# Patient Record
Sex: Male | Born: 1970 | Race: White | Hispanic: Yes | Marital: Married | Smoking: Current every day smoker
Health system: Southern US, Community
[De-identification: ages and names within clinical notes are randomized; demographics above are authoritative.]

## PROBLEM LIST (undated history)

## (undated) DIAGNOSIS — M109 Gout, unspecified: Secondary | ICD-10-CM

## (undated) DIAGNOSIS — I10 Essential (primary) hypertension: Secondary | ICD-10-CM

## (undated) HISTORY — DX: Gout, unspecified: M10.9

## (undated) HISTORY — PX: NO PAST SURGERIES: SHX2092

## (undated) HISTORY — DX: Essential (primary) hypertension: I10

---

## 2007-01-22 ENCOUNTER — Emergency Department (HOSPITAL_COMMUNITY): Admission: EM | Admit: 2007-01-22 | Discharge: 2007-01-22 | Payer: Self-pay | Admitting: Emergency Medicine

## 2010-09-06 ENCOUNTER — Emergency Department (HOSPITAL_COMMUNITY)
Admission: EM | Admit: 2010-09-06 | Discharge: 2010-09-06 | Payer: Self-pay | Source: Home / Self Care | Admitting: Family Medicine

## 2010-12-29 LAB — URIC ACID: Uric Acid, Serum: 8.1 mg/dL — ABNORMAL HIGH (ref 4.0–7.8)

## 2012-03-31 ENCOUNTER — Encounter (HOSPITAL_COMMUNITY): Payer: Self-pay | Admitting: Emergency Medicine

## 2012-03-31 ENCOUNTER — Emergency Department (HOSPITAL_COMMUNITY)
Admission: EM | Admit: 2012-03-31 | Discharge: 2012-03-31 | Disposition: A | Discharge status: Home / Self Care | Payer: 59 | Attending: Emergency Medicine | Admitting: Emergency Medicine

## 2012-03-31 DIAGNOSIS — F17200 Nicotine dependence, unspecified, uncomplicated: Secondary | ICD-10-CM | POA: Insufficient documentation

## 2012-03-31 DIAGNOSIS — H6690 Otitis media, unspecified, unspecified ear: Secondary | ICD-10-CM | POA: Insufficient documentation

## 2012-03-31 MED ORDER — AMOXICILLIN 500 MG PO CAPS: 500.0000 mg | ORAL_CAPSULE | Freq: Three times a day (TID) | ORAL | Status: AC

## 2012-03-31 MED ORDER — ANTIPYRINE-BENZOCAINE 5.4-1.4 % OT SOLN
3.0000 [drp] | Freq: Once | OTIC | Status: AC
  Administered 2012-03-31: 3 [drp] via OTIC
  Filled 2012-03-31: qty 10

## 2012-03-31 NOTE — Discharge Instructions (Signed)
Use Drops: 3-4 drops in right ear for pain.

## 2012-03-31 NOTE — ED Provider Notes (Signed)
Medical screening examination/treatment/procedure(s) were performed by non-physician practitioner and as supervising physician I was immediately available for consultation/collaboration.   Lars Jeziorski, MD 03/31/12 1641 

## 2012-03-31 NOTE — ED Notes (Signed)
Pt c/o right ear pain x 2 days 

## 2012-03-31 NOTE — ED Provider Notes (Signed)
History     CSN: [id number]  Arrival date & time 03/31/12  7829   First MD Initiated Contact with Patient 03/31/12 1049     10:50 AM HPI Patient reports a right-sided ear ache for 2 days. Reports he feels he has decreased hearing. Denies recent water activities. Denies ear drainage. Denies fever, sore throat, neck pain.  Patient is a 41 y.o. male presenting with ear pain. The history is provided by the patient.  Otalgia The current episode started 2 days ago. There is pain in the right ear. The problem occurs constantly. The problem has been gradually worsening. Maximum temperature: Subjective. The pain is moderate. Pertinent negatives include no ear discharge, no headaches, no rhinorrhea, no sore throat, no abdominal pain, no diarrhea, no vomiting, no neck pain, no cough and no rash. Hearing loss: decreased hearing. His past medical history does not include chronic ear infection.    History reviewed. No pertinent past medical history.  History reviewed. No pertinent past surgical history.  History reviewed. No pertinent family history.  History  Substance Use Topics  . Smoking status: Current Everyday Smoker  . Smokeless tobacco: Not on file  . Alcohol Use: Yes      Review of Systems  Constitutional: Positive for fever (Subjective). Negative for chills.  HENT: Positive for ear pain. Negative for congestion, sore throat, rhinorrhea, trouble swallowing, neck pain, sinus pressure, tinnitus and ear discharge. Hearing loss: decreased hearing.   Eyes: Negative for photophobia, pain and visual disturbance.  Respiratory: Negative for cough.   Gastrointestinal: Negative for vomiting, abdominal pain and diarrhea.  Skin: Negative for rash.  Neurological: Negative for dizziness and headaches.  All other systems reviewed and are negative.    Allergies  Review of patient's allergies indicates no known allergies.  Home Medications   Current Outpatient Rx  Name Route Sig Dispense  Refill  . ACETAMINOPHEN 325 MG PO TABS Oral Take 650 mg by mouth every 6 (six) hours as needed. For pain      BP 143/79  Pulse 92  Temp 98.3 F (36.8 C) (Oral)  Resp 16  SpO2 95%  Physical Exam  Constitutional: He is oriented to person, place, and time. He appears well-developed and well-nourished.  HENT:  Head: Normocephalic and atraumatic.  Right Ear: External ear and ear canal normal. No drainage or swelling. No mastoid tenderness. Tympanic membrane is injected, erythematous and bulging. Tympanic membrane is not scarred and not perforated. A middle ear effusion is present. No hemotympanum.  Left Ear: Tympanic membrane, external ear and ear canal normal.  Nose: Nose normal.  Mouth/Throat: Uvula is midline, oropharynx is clear and moist and mucous membranes are normal.  Eyes: Conjunctivae are normal. Pupils are equal, round, and reactive to light. Right eye exhibits no discharge. Left eye exhibits no discharge.  Neck: Normal range of motion. Neck supple.  Lymphadenopathy:    He has no cervical adenopathy.  Neurological: He is alert and oriented to person, place, and time.  Skin: Skin is warm and dry. No rash noted. No erythema. No pallor.  Psychiatric: He has a normal mood and affect. His behavior is normal.    ED Course  Procedures   MDM    Patient given Auralgan drops for pain. Also prescribed amoxicillin for otitis media.      Thomasene Lot, PA-C 03/31/12 1151

## 2013-07-10 ENCOUNTER — Emergency Department (HOSPITAL_COMMUNITY)
Admission: EM | Admit: 2013-07-10 | Discharge: 2013-07-10 | Disposition: A | Discharge status: Home / Self Care | Payer: 59 | Source: Home / Self Care | Attending: Family Medicine | Admitting: Family Medicine

## 2013-07-10 ENCOUNTER — Encounter (HOSPITAL_COMMUNITY): Payer: Self-pay | Admitting: Emergency Medicine

## 2013-07-10 DIAGNOSIS — M109 Gout, unspecified: Secondary | ICD-10-CM

## 2013-07-10 LAB — BASIC METABOLIC PANEL
Chloride: 98 mEq/L (ref 96–112)
Creatinine, Ser: 0.78 mg/dL (ref 0.50–1.35)
GFR calc Af Amer: >90 mL/min (ref >90)
Potassium: 4.1 mEq/L (ref 3.5–5.1)

## 2013-07-10 LAB — URIC ACID: Uric Acid, Serum: 7.6 mg/dL (ref 4.0–7.8)

## 2013-07-10 MED ORDER — HYDROCODONE-ACETAMINOPHEN 5-325 MG PO TABS: 1.0000 | ORAL_TABLET | Freq: Four times a day (QID) | ORAL | Status: DC | PRN

## 2013-07-10 MED ORDER — COLCHICINE 0.6 MG PO TABS: 0.6000 mg | ORAL_TABLET | Freq: Two times a day (BID) | ORAL | Status: DC | PRN

## 2013-07-10 NOTE — ED Provider Notes (Signed)
Jimmy Hart is a 42 y.o. male who presents to Urgent Care today for right great MTP pain. He notes great toe pain and swelling started over the last few days. He denies any injury. This is consistent with prior gout episode. He takes over-the-counter pain medications which have not helped. He denies any radiating pain weakness or numbness and feels well otherwise   History reviewed. No pertinent past medical history. History  Substance Use Topics  . Smoking status: Current Every Day Smoker  . Smokeless tobacco: Not on file  . Alcohol Use: Yes   ROS as above Medications reviewed. No current facility-administered medications for this encounter.   Current Outpatient Prescriptions  Medication Sig Dispense Refill  . acetaminophen (TYLENOL) 325 MG tablet Take 650 mg by mouth every 6 (six) hours as needed. For pain      . colchicine 0.6 MG tablet Take 1 tablet (0.6 mg total) by mouth 2 (two) times daily as needed.  60 tablet  0  . HYDROcodone-acetaminophen (NORCO/VICODIN) 5-325 MG per tablet Take 1 tablet by mouth every 6 (six) hours as needed for pain.  15 tablet  0    Exam:  BP 142/103  Pulse 75  Temp(Src) 99.1 F (37.3 C) (Oral)  Resp 20  SpO2 98% Gen: Well NAD RIGHT GREAT TOE MTP: Red and swollen. Pain with motion and tender to touch.  Capillary refill and sensation is intact distally  Results for orders placed during the hospital encounter of 07/10/13 (from the past 24 hour(s))  URIC ACID     Status: None   Collection Time    07/10/13 10:12 AM      Result Value Range   Uric Acid, Serum 7.6  4.0 - 7.8 mg/dL  BASIC METABOLIC PANEL     Status: Abnormal   Collection Time    07/10/13 10:12 AM      Result Value Range   Sodium 137  135 - 145 mEq/L   Potassium 4.1  3.5 - 5.1 mEq/L   Chloride 98  96 - 112 mEq/L   CO2 28  19 - 32 mEq/L   Glucose, Bld 110 (*) 70 - 99 mg/dL   BUN 10  6 - 23 mg/dL   Creatinine, Ser 1.61  0.50 - 1.35 mg/dL   Calcium 09.6  8.4 - 04.5 mg/dL   GFR calc non Af Amer >90  >90 mL/min   GFR calc Af Amer >90  >90 mL/min   No results found.  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  Plan to use colchicine and hydrocodone for pain control Followup with Dr. Eustaquio Boyden at [facility] Discussed warning signs or symptoms. Please see discharge instructions. Patient expresses understanding.      Rodolph Bong, MD 07/10/13 [phone]

## 2013-07-10 NOTE — ED Notes (Signed)
Pt c/o gout flare up on right foot/greater toe onset Sunday Sxs include: swelling, localized fever, redness pain that increases w/activity Denies: fevers, inj/trauma He is alert w/no signs of acute distress.

## 2013-07-16 NOTE — ED Notes (Signed)
Uric Acid: 7.6 high normal. Pt. treated with Colchicine and Hydrocodone.  Hx. of gout. Instructed to f/u with his PCP. Vassie Moselle 07/16/2013

## 2013-07-17 ENCOUNTER — Telehealth (HOSPITAL_COMMUNITY): Payer: Self-pay | Admitting: *Deleted

## 2013-07-18 ENCOUNTER — Telehealth (HOSPITAL_COMMUNITY): Payer: Self-pay | Admitting: *Deleted

## 2013-07-20 ENCOUNTER — Telehealth (HOSPITAL_COMMUNITY): Payer: Self-pay | Admitting: *Deleted

## 2013-07-20 NOTE — ED Notes (Signed)
Dr. Denyse Amass asked me to notify pt. of his uric acid result and tell him to f/u with his PCP. Unable to reach pt. by phone 9/30 and 10/1-both phone numbers disconnected.  Letter sent with this information. Vassie Moselle 07/20/2013

## 2014-10-26 ENCOUNTER — Ambulatory Visit (INDEPENDENT_AMBULATORY_CARE_PROVIDER_SITE_OTHER): Payer: 59 | Admitting: Emergency Medicine

## 2014-10-26 VITALS — BP 162/110 | HR 79 | Temp 98.5°F | Resp 16 | Ht 65.75 in | Wt 185.0 lb

## 2014-10-26 DIAGNOSIS — M10072 Idiopathic gout, left ankle and foot: Secondary | ICD-10-CM

## 2014-10-26 DIAGNOSIS — I10 Essential (primary) hypertension: Secondary | ICD-10-CM

## 2014-10-26 DIAGNOSIS — M109 Gout, unspecified: Secondary | ICD-10-CM

## 2014-10-26 MED ORDER — INDOMETHACIN 25 MG PO CAPS
25.0000 mg | ORAL_CAPSULE | Freq: Three times a day (TID) | ORAL | Status: DC
Start: 2014-10-26 — End: 2016-01-29

## 2014-10-26 MED ORDER — LISINOPRIL 20 MG PO TABS: 20.0000 mg | ORAL_TABLET | Freq: Every day | ORAL | Status: DC

## 2014-10-26 MED ORDER — COLCHICINE 0.6 MG PO TABS: ORAL_TABLET | ORAL | Status: DC

## 2014-10-26 NOTE — Addendum Note (Signed)
Addended by: Carmelina DaneANDERSON, Salam Micucci S on: 10/26/2014 11:53 AM   Modules accepted: Level of Service

## 2014-10-26 NOTE — Progress Notes (Signed)
[facility] [address], [place] [id number] [phone]  Date:  10/26/2014   Name:  Jimmy Hart   DOB:  04-Nov-1970   MRN:  [id number]  PCP:  No PCP Per Patient    Chief Complaint: Gout   History of Present Illness:  Jimmy Hart is a 44 y.o. very pleasant male patient who presents with the following:  History of gout.  Has sudden pain with no history of injury or overuse in left ankle. No fever or chillls Marked difficulty ambulating and bearing weight No hisotry of known hypertension No improvement with over the counter medications or other home remedies. Denies other complaint or health concern today.   There are no active problems to display for this patient.   History reviewed. No pertinent past medical history.  History reviewed. No pertinent past surgical history.  History  Substance Use Topics  . Smoking status: Current Every Day Smoker  . Smokeless tobacco: Not on file  . Alcohol Use: Yes    History reviewed. No pertinent family history.  No Known Allergies  Medication list has been reviewed and updated.  Current Outpatient Prescriptions on File Prior to Visit  Medication Sig Dispense Refill  . acetaminophen (TYLENOL) 325 MG tablet Take 650 mg by mouth every 6 (six) hours as needed. For pain    . colchicine 0.6 MG tablet Take 1 tablet (0.6 mg total) by mouth 2 (two) times daily as needed. (Patient not taking: Reported on 10/26/2014) 60 tablet 0  . HYDROcodone-acetaminophen (NORCO/VICODIN) 5-325 MG per tablet Take 1 tablet by mouth every 6 (six) hours as needed for pain. (Patient not taking: Reported on 10/26/2014) 15 tablet 0   No current facility-administered medications on file prior to visit.    Review of Systems:  As per HPI, otherwise negative.    Physical Examination: Filed Vitals:   10/26/14 1130  BP: 162/110  Pulse: 79  Temp: 98.5 F (36.9 C)  Resp: 16   Filed Vitals:   10/26/14 1130  Height: 5' 5.75"  (1.67 m)  Weight: 185 lb (83.915 kg)   Body mass index is 30.09 kg/(m^2). Ideal Body Weight: Weight in (lb) to have BMI = 25: 153.4   GEN: WDWN, NAD, Non-toxic, Alert & Oriented x 3 HEENT: Atraumatic, Normocephalic.  Ears and Nose: No external deformity. EXTR: No clubbing/cyanosis/edema NEURO: Normal gait.  PSYCH: Normally interactive. Conversant. Not depressed or anxious appearing.  Calm demeanor.  LEFT ankle exquisitely tender some swelling no erythema or ecchymosis.  No deformity   Assessment and Plan: Gout Hypertension Lisinopril Colchicine Indocin  Signed,  Phillips OdorJeffery Lanna Labella, MD

## 2014-10-26 NOTE — Patient Instructions (Signed)
Hipertensin (Hypertension) La hipertensin, conocida comnmente como presin arterial alta, se produce cuando la sangre bombea en las arterias con mucha fuerza. Las arterias son los vasos sanguneos que transportan la sangre desde el corazn hacia todas las partes del cuerpo. Una lectura de la presin arterial consiste en un nmero ms alto sobre un nmero ms bajo, por ejemplo, 110/72. El nmero ms alto (presin sistlica) corresponde a la presin interna de las arterias cuando el corazn [place]. El nmero ms bajo (presin diastlica) corresponde a la presin interna de las arterias cuando el corazn se relaja. En condiciones ideales, la presin arterial debe ser inferior a 120/80. La hipertensin fuerza al corazn a trabajar ms para Herbalist. Las arterias pueden estrecharse o ponerse rgidas. La hipertensin conlleva el riesgo de enfermedad cardaca, ictus y otros problemas.  [place] de riesgo de hipertensin son controlables, pero otros no lo son.  [employer] factores de riesgo que usted no puede Chief Technology Officer, se incluyen:   Manufacturing systems engineer. El riesgo es mayor para las Retail banker.  La edad. Los riesgos aumentan con la edad.  El sexo. Antes de los 45aos, los hombres corren ms [employer]. Despus de los 65aos, las mujeres corren ms [employer]. Entre los factores de riesgo que usted puede Chief Technology Officer, se incluyen:  No hacer la cantidad suficiente de actividad fsica o ejercicio.  Tener sobrepeso.  Consumir mucha grasa, azcar, caloras o sal en la dieta.  Beber alcohol en exceso. SIGNOS Y SNTOMAS Por lo general, la hipertensin no causa signos o sntomas. La hipertensin demasiado alta (crisis hipertensiva) puede causar dolor de cabeza, ansiedad, falta de aire y hemorragia nasal. DIAGNSTICO  Para detectar si usted tiene hipertensin, el mdico le medir la presin arterial mientras est sentado, con el brazo  levantado a la altura del corazn. Debe medirla al [facility] veces en el mismo brazo. Determinadas condiciones pueden causar una diferencia de presin arterial entre el brazo izquierdo y Insurance underwriter. El hecho de tener una sola lectura de la presin arterial ms alta que lo normal no significa que Stage manager. En el caso de tener una lectura de la presin arterial con un valor alto, pdale al mdico que la verifique nuevamente. [place] hipertensin arterial incluye hacer cambios en el estilo de vida y, posiblemente, tomar medicamentos. Un estilo de vida saludable puede ayudar a bajar la presin arterial alta. Quiz deba cambiar algunos hbitos. Los [employer] Strauss en el estilo de vida pueden incluir:  Seguir la dieta DASH. Esta dieta tiene un alto contenido de frutas, verduras y Psychologist, prison and probation services. Incluye poca cantidad de sal, carnes rojas y azcares agregados.  Hacer al menos 2horas de actividad fsica enrgica todas las semanas.  Perder peso, si es necesario.  No fumar.  Limitar el consumo de bebidas alcohlicas.  Aprender formas de reducir el estrs. Si los cambios en el estilo de vida no son suficientes para Child psychotherapist la presin arterial, el mdico puede recetarle medicamentos. Quiz necesite tomar ms de uno. Trabaje en conjunto con su mdico para comprender los riesgos y los beneficios. INSTRUCCIONES PARA EL CUIDADO EN EL HOGAR  Haga que le midan de nuevo la presin arterial segn las indicaciones del [place] los medicamentos solamente como se lo haya indicado el mdico. Siga cuidadosamente las indicaciones. Los medicamentos para la presin arterial deben tomarse segn las indicaciones. Los medicamentos pierden eficacia al omitir las dosis. El hecho de omitir  las dosis tambin One William Carls Driveaumenta el riesgo de otros Altaproblemas.  No fume.  Contrlese la presin arterial en su casa segn las indicaciones del mdico. SOLICITE ATENCIN MDICA SI:   Piensa  que tiene una reaccin alrgica a los medicamentos.  Tiene mareos o dolores de cabeza con Naval architectrecurrencia.  Tiene hinchazn en los tobillos.  Tiene problemas de visin. SOLICITE ATENCIN MDICA DE INMEDIATO SI:  Siente un dolor de cabeza intenso o confusin.  Siente debilidad inusual, adormecimiento o que Hospital doctorse desmayar.  Siente dolor intenso en el pecho o en el abdomen.  Vomita repetidas veces.  Tiene dificultad para respirar. ASEGRESE DE QUE:   Comprende estas instrucciones.  Controlar su afeccin.  Recibir ayuda de inmediato si no mejora o si empeora. Document Released: 10/04/2005 Document Revised: 02/18/2014 [facility] Patient Information 2015 [place], [place]. This information is not intended to replace advice given to you by your health care provider. Make sure you discuss any questions you have with your health care provider. Gota  (Gout)  La gota es una artritis inflamatoria originada por la acumulacin de cristales de cido rico en las articulaciones. El cido rico es una sustancia qumica que normalmente se encuentra en la [place]. Cuando los niveles de cido rico en la sangre son muy elevados, pueden formarse cristales que se depositan en las articulaciones y los tejidos. Esto causa irritacin, dolor e hinchazn (inflamacin). La repeticin de los ataques es frecuente. Con el tiempo, los cristales de cido rico pueden formar Toys ''R'' Usmasas (tofos) cerca de Risk analystuna articulacin, destruyen el [place] y provocan una deformidad La gota es una enfermedad que puede tratarse y con frecuencia puede prevenirse. CAUSAS  La enfermedad comienza con niveles altos de cido rico en la sangre. El organismo produce cido rico cuando metaboliza una sustancia que se encuentra en Binghamton Universityestado natural, denominada purina. Ciertos alimentos, como carnes y pescado, contienen grandes cantidades de purinas. Las causas de cido rico elevado son:   Ser transmitida de padres a hijos (hereditaria).  Enfermedades que  causan un aumento de la produccin de cido rico (como la obesidad, la psoriasis y ciertos tipos de Database administratorcncer).  Abuso en el consumo de bebidas alcohlicas.  La dieta, especialmente las Ryder Systemdietas en las que se consume Iranmucha carne y frutos de mar.  Ciertos medicamentos, como los que combaten el cncer (quimioterapia), los diurticos y la aspirina.  Enfermedades renales crnicas. Los riones no pueden eliminar bien el cido rico.  Problemas con el metabolismo. Las enfermedades fuertemente asociadas a la gota son:   Janene HarveyObesidad.  Hipertensin arterial.  Colesterol elevado.  Diabetes. No todas las personas con niveles elevados de cido rico sufren gota. No se comprende an por qu algunas personas padecen gota y otras no. Las Ventresscirugas, lesiones en una articulacin y el consumo excesivo de ciertos alimentos son algunos de los factores que pueden provocar ataques de [place].  SNTOMAS   Un ataque de gota puede comenzar rpidamente. Causa un dolor intenso, con irritacin, hinchazn y calor en una articulacin.  Puede haber fiebre.  Generalmente slo una articulacin es [place] Rapidsafectada. Ciertas articulaciones se ven implicadas con ms frecuencia.  La base del dedo gordo del pie.  La rodilla.  El tobillo.  Webb LawsLa mueca.  Un dedo. Sin tratamiento, un ataque por lo general desaparece en unos pocos das o semanas. Entre un ataque y otro, por lo general no hay sntomas, lo cual es diferente de At[place]portmuchas otras formas de artritis.  DIAGNSTICO  El profesional reunir la informacin basndose en los sntomas y el examen fsico. En  algunos [employer]. Estos estudios pueden ser:   Anlisis de [place].  Anlisis de [place].  Radiografas.  Anlisis de los fluidos de Nurse, learning disability. En este estudio se necesita una aguja para extraer lquido de la articulacin (artrocentesis). Con el uso del microscopio, se confirma la gota cuando se observan los cristales de cido rico en el lquido de Merchant navy officer. TRATAMIENTO  ONEOK fases en el tratamiento de la gota: el tratamiento del ataque de aparicin repentina (agudo) y la prevencin de los ataques (profilaxis).   Tratamiento de un ataque agudo.  Se utilizan medicamentos. Se indican antiinflamatorios o corticoides.  En algunos casos es necesario inyectar un corticoide en la articulacin afectada.  La articulacin dolorosa se pone en reposo. El movimiento puede empeorar la artritis.  Puede utilizar tanto tratamientos con calor o con fro para Scientific laboratory technician, segn lo que le haga mejor.  Tratamiento para prevenir ataques.  Si sufre de ataques de gota frecuentes, el mdico puede recomendarle medicamentos preventivos. Estos medicamentos se administran despus de que el ataque agudo [place]. Estos medicamentos ayudan a los riones a Land cido rico del organismo o a Conservator, museum/gallery produccin de cido rico. Es posible que deba Chemical engineer estos medicamentos durante un largo [place].  La primera fase del tratamiento preventiva puede asociarse con un aumento de los ataques agudos de [place]. Por esta razn, durante los primeros meses de [place] mdico puede tambin aconsejarle que tome medicamentos que habitualmente se [place] para el tratamiento de la gota aguda. Asegrese de comprender todas las indicaciones del mdico. El mdico podr hacer varios ajustes a la dosis del medicamento antes de que comiencen a ser Geologist, engineering.  Comente el tratamiento diettico con su mdico o nutricionista. El alcohol y las bebidas que contienen gran cantidad de International aid/development worker y fructosa y los alimentos como la carne, el pollo y los frutos de mar pueden aumentar los niveles de cido rico. El mdico o el nutricionista podr [employer] las bebidas y los alimentos que debe limitar. INSTRUCCIONES PARA EL CUIDADO EN EL HOGAR   No tome aspirina para el dolor. Esto eleva los niveles de cido rico.  Slo tome medicamentos de venta  libre o prescriptos para Primary school teacher, las molestias o Publishing copy la fiebre segn las indicaciones de su mdico.  Sports administrator reposo todo el tiempo que pueda. Cuando se encuentre en la cama, mantenga las sbanas y [address] alejadas de las articulaciones doloridas.  Mantenga la articulacin afectada levantada (elevada).  Aplique compresas fras o calientes sobre las articulaciones doloridas. el uso de compresas calientes o fras depende de lo que [employer] resulte a usted.  Utilice muletas si la articulacin que le duele es de la pierna.  Debe ingerir gran cantidad de lquido para mantener la orina de tono claro o color amarillo plido. Esto ayudar a que el organismo elimine el cido rico. Limite el consumo de alcohol, bebidas azucaradas y que contengan fructosa.  Siga las indicaciones con respecto a la dieta. Preste especial atencin a la cantidad de protenas que consume. En la dieta diaria debe enfatizar el consumo de frutas, vegetales, granos enteros y productos lcteos descremados. Comente con su mdico o nutricionista acerca del consumo de caf, vitamina C o cerezas. Estos pueden ayudar a [employer] de cido rico.  Mantenga un peso corporal adecuado. SOLICITE ATENCIN MDICA SI:   Tiene diarrea, vmitos o algn efecto secundario provocado por los medicamentos.  No se siente mejor en 24 horas, o  empeora. SOLICITE ATENCIN MDICA DE INMEDIATO SI:   Las articulaciones le duelen ms de manera repentina, tiene escalofros o fiebre. ASEGRESE DE QUE:   Comprende estas instrucciones.  Controlar su enfermedad.  Solicitar ayuda de inmediato si no mejora o si empeora. Document Released: 07/14/2005 Document Revised: 01/29/2013 [facility] Patient Information 2015 [place], [place]. This information is not intended to replace advice given to you by your health care provider. Make sure you discuss any questions you have with your health care provider.

## 2015-05-01 ENCOUNTER — Ambulatory Visit: Payer: Worker's Compensation

## 2015-05-01 ENCOUNTER — Encounter: Payer: Self-pay | Admitting: Physician Assistant

## 2015-05-01 ENCOUNTER — Ambulatory Visit (INDEPENDENT_AMBULATORY_CARE_PROVIDER_SITE_OTHER): Payer: 59 | Admitting: Emergency Medicine

## 2015-05-01 DIAGNOSIS — S61219A Laceration without foreign body of unspecified finger without damage to nail, initial encounter: Secondary | ICD-10-CM

## 2015-05-01 DIAGNOSIS — T148XXA Other injury of unspecified body region, initial encounter: Secondary | ICD-10-CM

## 2015-05-01 DIAGNOSIS — Z23 Encounter for immunization: Secondary | ICD-10-CM | POA: Diagnosis not present

## 2015-05-01 DIAGNOSIS — S61316A Laceration without foreign body of right little finger with damage to nail, initial encounter: Secondary | ICD-10-CM | POA: Diagnosis not present

## 2015-05-01 DIAGNOSIS — I10 Essential (primary) hypertension: Secondary | ICD-10-CM | POA: Insufficient documentation

## 2015-05-01 DIAGNOSIS — S61319A Laceration without foreign body of unspecified finger with damage to nail, initial encounter: Secondary | ICD-10-CM

## 2015-05-01 DIAGNOSIS — T148 Other injury of unspecified body region: Secondary | ICD-10-CM

## 2015-05-01 MED ORDER — CEPHALEXIN 500 MG PO CAPS: 500.0000 mg | ORAL_CAPSULE | Freq: Three times a day (TID) | ORAL | Status: DC

## 2015-05-01 NOTE — Progress Notes (Signed)
   Subjective:    Patient ID: Jimmy Hart, male    DOB: 26-Sep-1971, 44 y.o.   MRN: [id number]  Chief Complaint  Patient presents with  . Laceration   Medications, allergies, past medical history, surgical history, family history, social history and problem list reviewed and updated.  HPI  [age] yom presents after sustaining laceration to right 5th digit. Caught finger in blade of mower. Approx 1.5 hrs pta. Unsure of last tdap.   Review of Systems No cp, sob.     Objective:   Physical Exam  Constitutional: He is oriented to person, place, and time.  Neurological: He is alert and oriented to person, place, and time.  Skin:  Elliptical laceration to dorsal distal right 5th digit. Proximal base of fingernail is intact and fully exposed. Decreased strength but intact finger extension and flexion with testing. Normal sensation.    [facility] reading (PRIMARY) by  Dr. Dareen PianoAnderson. Right 5th digit findings: Open tufts fracture.      Assessment & Plan:   Finger laceration, initial encounter - Plan: DG Finger Little Right, cephALEXin (KEFLEX) 500 MG capsule  Nailbed laceration, finger, initial encounter - Plan: cephALEXin (KEFLEX) 500 MG capsule  Open fracture  Need for Tdap vaccination - Plan: Tdap vaccine greater than or equal to 7yo IM --tufts fx --> keflex --wound repair as per procedure note, finger wrapped and placed in fold over splint --pt to rtc 4 days for wound check  --tdap given  Jimmy Hart M. Jimmy Holstine, PA-C Physician Assistant-Certified [facility]  05/01/2015 5:27 PM

## 2015-05-01 NOTE — Patient Instructions (Signed)
Please keep the dressing in place for 24 hours.  You can shower normally after 24 hours.  Please take the antibiotic three times daily for the next 10 days.  Please keep the splint over the finger for the next few days.  Please come back to see us on Monday.    Cuidados de Hotel manageruna laceracin - Adultos  (Laceration Care, Adult)  Una herida cortante es un corte o lesin que atraviesa todas las capas de la piel y el tejido que se encuentra debajo de la piel.  TRATAMIENTO  Algunas laceraciones no requieren sutura. Algunas no deben cerrarse debido a que puede aumentar el riesgo de infeccin. Es importante que consulte al mdico lo antes posible despus de recibir una lesin para minimizar el riesgo de infeccin y aumentar la posibilidad de que se cierre con xito.  Cuando se cierra adecuadamente, podrn indicarle analgsicos, si los necesita. La herida debe limpiarse para combatir la infeccin. El mdico usar puntos (suturas), [place],adhesivo, o tiras Cascadeadhesivas para Environmental consultantreparar la laceracin. Estos elementos mantendrn unidos los bordes de la piel para que se cure ms rpidamente y para un mejor resultado cosmtico. Sin embargo, todas las heridas se curarn con una cicatriz. Una vez que la herida se haya curado, las cicatrices pueden minimizarse cubriendo la herida con pantalla solar durante el da por un lapso se 1 ao.  INSTRUCCIONES PARA EL CUIDADO EN EL HOGAR  Si tiene puntos o grapas:   Mantenga la herida limpia y [place] Islandsseca.  Si tiene un (vendaje) cmbielo al menos una vez al da. Cmbielo si se moja o se ensucia, o segn las indicaciones del mdico.  Lave el corte dos veces por da con agua y [place]jabn. Enjuguelo con agua para quitar todo el Imperialjabn. Seque dando palmaditas con una toalla limpia y seca.  Despus de limpiar, aplique una delgada capa de una crema con antibitico segn las indicaciones del mdico. Esto le ayudar a prevenir las infecciones y a Automotive engineerevitar que el vendaje se Building services engineeradhiera.  Puede ducharse  despus de las primeras 24 horas. No remoje la herida en agua hasta que le hayan quitado los puntos.  Solo tome medicamentos que se pueden comprar sin receta o recetados para Chief Technology Officerel dolor, Dentistmalestar o fiebre, como le indica el mdico.  Concurra para que le retiren los puntos o las grapas cuando el mdico le indique. En caso que tenga tiras ZOXWRUEAVadhesivas:   Mantenga la herida limpia y seca.  No deje que las tiras se mojen. Puede darse un bao cuidando de Devon Energymantener la herida seca.  Si se moja, squela dando palmaditas con una toalla limpia.  Las tiras caern por s mismas. Puede recortar las tiras a medida que la herida se [place]. No quite las tiras que estn pegadas a la herida. Ellas se caern cuando sea el momento. En caso que le hayan Jacksonvilleaplicado adhesivo.   Podr mojara momentneamente la herida en la ducha o el bao. No frote ni sumerja la herida. No practique natacin. Evite transpirar con abundancia hasta que el Slaughteradhesivo se haya cado. Despus de ducharse o darse un bao, seque el corte dando palmaditas con una toalla limpia.  No aplique medicamentos lquidos, en crema o ungentos mientras el QUALCOMMadhesivo est en su lugar. Podr aflojarlo antes de que la herida se cure.  Si tiene un vendaje, tenga cuidado de no aplicar cinta adhesiva directamente Lehman Brotherssobre el adhesivo. Esto puede hacer que el Morrisonvilleadhesivo se caiga antes de que la herida se haya curado.  Evite la exposicin prolongada  a la luz del sol o a la International aid/development worker en [employer] se Arts administrator. La exposicin a los Hydrographic surveyor la Training and development officer.  El [employer] la piel durante 5 a 10 das y Therapist, occupational. No quite la pelcula de [place]. Deber aplicarse la vacuna contra el ttanos si:  No recuerda cundo se coloc la vacuna la ltima vez.  Nunca recibi esta vacuna. Si le han aplicado la vacuna contra el ttanos, el brazo podr hincharse, enrojecer y sentirse caliente al  tacto. Esto es frecuente y no es un problema. Si usted necesita aplicarse la vacuna y se niega a recibirla, corre riesgo de contraer ttanos. sta es una enfermedad grave.  SOLICITE ATENCIN MDICA SI:   Presenta enrojecimiento, hinchazn o aumento del dolor en la herida.  Hay rayas rojas que salen de la herida.  Observa un lquido blanco amarillento (pus) en la herida.  Tiene fiebre.  Advierte un olor ftido que proviene de la herida o del vendaje.  La herida se abre luego de que le han extrado las suturas.  Nota que en la herida hay algn cuerpo extrao como un trozo de [place] o vidrio.  La herida est en su mano o pie y observa que no puede mover correctamente los dedos. SOLICITE ATENCIN MDICA DE INMEDIATO SI:   El dolor no se alivia con los [employer].  Hay una zona muy hinchada alrededor de la herida que le causa dolor y adormecimiento, o advierte un cambio en el color en el brazo, la mano, la pierna o el pie.  La herida se abre y sangra nuevamente.  Siente que el adormecimiento, la debilidad o la prdida de la funcin de la articulacin que rodea la herida [place].  Palpa ndulos dolorosos cerca de la herida o bajo la piel en cualquier zona del cuerpo. ASEGRESE DE QUE:   Comprende estas instrucciones.  Controlar su enfermedad.  Solicitar ayuda de inmediato si no mejora o si empeora. Document Released: 10/04/2005 Document Revised: 12/27/2011 [facility] Patient Information 2015 [place], [place]. This information is not intended to replace advice given to you by your health care provider. Make sure you discuss any questions you have with your health care provider.

## 2015-05-01 NOTE — Progress Notes (Signed)
Verbal consent obtained from patient.  Digital block anesthesia with 2cc marcaine 3 cc 1% lido plain. Wound scrubbed with soap and water and rinsed.  Nail lifter inserted under nail to remove nail intact. Wound closed with #5 5-0 ethilon simple interrupted sutures.  Wound bed closed with #3 5-0 vicryl sutures. Nail reinserted to provide protection to nail. Nail sutured down with #2 5-0 prolene sutures. Wound cleansed and dressed. Fold over splint placed over wound.

## 2015-05-01 NOTE — Progress Notes (Signed)
  Medical screening examination/treatment/procedure(s) were performed by non-physician practitioner and as supervising physician I was immediately available for consultation/collaboration.     

## 2015-05-05 ENCOUNTER — Ambulatory Visit (INDEPENDENT_AMBULATORY_CARE_PROVIDER_SITE_OTHER): Payer: 59 | Admitting: Physician Assistant

## 2015-05-05 VITALS — BP 128/72 | HR 62 | Temp 98.2°F | Resp 14 | Ht 65.5 in | Wt 187.4 lb

## 2015-05-05 DIAGNOSIS — Z5189 Encounter for other specified aftercare: Secondary | ICD-10-CM

## 2015-05-05 NOTE — Patient Instructions (Signed)
Please be sure to change the dressing 1-2 times daily to prevent the wound from getting too wet.  Please apply hydrogen peroxide to the wound to prevent crusting.  Please uncover the wound and keep it open while you are home and relaxing, be sure to keep it covered while working.  Please come back to see us on Thursday.

## 2015-05-05 NOTE — Progress Notes (Signed)
   Subjective:    Patient ID: Jimmy Hart, male    DOB: 1971/03/08, 44 y.o.   MRN: [id number]  Chief Complaint  Patient presents with  . Wound Check   HPI  44 yom presents for wound check.   Was seen here 4 days ago with laceration and open tufts fx. Had sutures placed. He had a fingernail avulsion at that time which was sutured back on at that visit to protect the nail bed. He returns today for wound check.   He has not removed the dressing at all since his visit 4 days ago. Has been taking the keflex as prescribed. Denies fevers, chills.   Review of Systems     Objective:   Physical Exam  Constitutional: He appears well-developed and well-nourished.  Non-toxic appearance. He does not have a sickly appearance. He does not appear ill. No distress.  BP 128/72 mmHg  Pulse 62  Temp(Src) 98.2 F (36.8 C) (Oral)  Resp 14  Ht 5' 5.5" (1.664 m)  Wt 187 lb 6.4 oz (85.004 kg)  BMI 30.70 kg/m2  SpO2 98%   Skin:  Bandage removed. Soaked in blood. Crusted blood over sutures. Maceration all around distal finger from midway between PIP/DIP to tip. Odorous. No purulence.       Assessment & Plan:   Visit for wound check --#2 sutures removed that were holding nail in place to protect nailbed, entire nail removed --xeroform placed over nailbed --strict instructions given to change dressing daily and leave open to air out while home and clean --hydrogen peroxide once daily over next few days to prevent crusting blood --ok to shower as normal and clean lightly with soap/water --rtc 3 days for suture removal   [name] M. Lyndel Dancel, PA-C Physician Assistant-Certified [facility]  05/05/2015 5:39 PM

## 2015-05-06 NOTE — Progress Notes (Signed)
  Medical screening examination/treatment/procedure(s) were performed by non-physician practitioner and as supervising physician I was immediately available for consultation/collaboration.     

## 2015-05-08 ENCOUNTER — Ambulatory Visit (INDEPENDENT_AMBULATORY_CARE_PROVIDER_SITE_OTHER): Payer: 59 | Admitting: Physician Assistant

## 2015-05-08 VITALS — BP 128/60 | HR 81 | Temp 98.1°F | Resp 16

## 2015-05-08 DIAGNOSIS — Z5189 Encounter for other specified aftercare: Secondary | ICD-10-CM | POA: Diagnosis not present

## 2015-05-08 DIAGNOSIS — T148 Other injury of unspecified body region: Secondary | ICD-10-CM

## 2015-05-08 DIAGNOSIS — M1009 Idiopathic gout, multiple sites: Secondary | ICD-10-CM | POA: Diagnosis not present

## 2015-05-08 DIAGNOSIS — M109 Gout, unspecified: Secondary | ICD-10-CM

## 2015-05-08 DIAGNOSIS — T148XXA Other injury of unspecified body region, initial encounter: Secondary | ICD-10-CM

## 2015-05-08 MED ORDER — COLCHICINE 0.6 MG PO TABS: ORAL_TABLET | ORAL | Status: DC

## 2015-05-08 NOTE — Progress Notes (Signed)
[facility] [address] [place] [address] [phone]  Date:  05/08/2015   Name:  Jimmy Hart   DOB:  Oct 05, 1971   MRN:  [id number]  PCP:  No PCP Per Patient    Chief Complaint: Wound Check   History of Present Illness:  This is a 44 y.o. male with PMH HTN who is presenting for suture removal and wound check. He sustained nailbed and finger laceration with tufts fracture of his right 5th finger on 7/14. He was prescribed keflex. He returned 4 days later for wound check. He had not removed dressing since placed at first visit. Wound was odorous and macerated but no purulence noted. Xeroform placed over nailbed and wound care discussed with patient. Pt is presenting for suture removal. He has been changing dressing daily. Has continued. Having minimal pain. He denies fever or chills.  Pt also wanting refill of his colchicine.  Review of Systems:  Review of Systems See HPI  Patient Active Problem List   Diagnosis Date Noted  . Essential hypertension 05/01/2015    Prior to Admission medications   Medication Sig Start Date End Date Taking? Authorizing Provider  acetaminophen (TYLENOL) 325 MG tablet Take 650 mg by mouth every 6 (six) hours as needed. For pain   Yes Historical Provider, MD  cephALEXin (KEFLEX) 500 MG capsule Take 1 capsule (500 mg total) by mouth 3 (three) times daily. 05/01/15  Yes Todd McVeigh, PA  indomethacin (INDOCIN) 25 MG capsule Take 1 capsule (25 mg total) by mouth 3 (three) times daily with meals. 10/26/14  Yes Carmelina Dane, MD  lisinopril (PRINIVIL,ZESTRIL) 20 MG tablet Take 1 tablet (20 mg total) by mouth daily. 10/26/14  Yes Carmelina Dane, MD  colchicine 0.6 MG tablet 2 now and one in one hour.  Tomorrow one daily Patient not taking: Reported on 05/01/2015 10/26/14   Carmelina Dane, MD    No Known Allergies  History reviewed. No pertinent past surgical history.  History  Substance Use Topics  . Smoking status:  Current Every Day Smoker  . Smokeless tobacco: Not on file  . Alcohol Use: Yes    History reviewed. No pertinent family history.  Medication list has been reviewed and updated.  Physical Examination:  Physical Exam  Constitutional: He is oriented to person, place, and time. He appears well-developed and well-nourished. No distress.  HENT:  Head: Normocephalic and atraumatic.  Right Ear: Hearing normal.  Left Ear: Hearing normal.  Nose: Nose normal.  Eyes: Conjunctivae and lids are normal. Right eye exhibits no discharge. Left eye exhibits no discharge. No scleral icterus.  Pulmonary/Chest: Effort normal. No respiratory distress.  Musculoskeletal: Normal range of motion.  Neurological: He is alert and oriented to person, place, and time.  Skin: Skin is warm, dry and intact.  Right little finger #5 sutures intact. Xeroform in placed over nailbed. No purulence or signs of infection. Mild ttp along cuticle. Sutures removed.  Psychiatric: He has a normal mood and affect. His speech is normal and behavior is normal. Thought content normal.   BP 128/60 mmHg  Pulse 81  Temp(Src) 98.1 F (36.7 C) (Oral)  Resp 16  Assessment and Plan:  1. Visit for wound check 2. Open fracture No signs of infection. Sutures removed. Wound care discussed. Soak finger in 2-3 days to remove xeroform. Continue splint for 1 more week. Return with further problems/concerns.  3. Gouty arthritis Medication refilled. - colchicine 0.6 MG tablet; 2 now  and one in one hour.  Tomorrow one daily  Dispense: 30 tablet; Refill: 1   Nicholai Willette V. Dyke Brackett, MHS [facility]  05/08/2015

## 2015-05-08 NOTE — Patient Instructions (Signed)
Leave wound open to air Continue splint for next 1 week. In 2 days, soak finger to get yellow dressing off. Continue pills for infection. Return with further problems/concerns.

## 2015-10-17 ENCOUNTER — Ambulatory Visit (INDEPENDENT_AMBULATORY_CARE_PROVIDER_SITE_OTHER): Payer: 59 | Admitting: Urgent Care

## 2015-10-17 ENCOUNTER — Ambulatory Visit (INDEPENDENT_AMBULATORY_CARE_PROVIDER_SITE_OTHER): Payer: 59

## 2015-10-17 VITALS — BP 134/90 | HR 83 | Temp 98.3°F | Resp 18 | Ht 65.0 in | Wt 183.0 lb

## 2015-10-17 DIAGNOSIS — M109 Gout, unspecified: Secondary | ICD-10-CM

## 2015-10-17 DIAGNOSIS — Z8739 Personal history of other diseases of the musculoskeletal system and connective tissue: Secondary | ICD-10-CM

## 2015-10-17 DIAGNOSIS — M6248 Contracture of muscle, other site: Secondary | ICD-10-CM | POA: Diagnosis not present

## 2015-10-17 DIAGNOSIS — M1009 Idiopathic gout, multiple sites: Secondary | ICD-10-CM

## 2015-10-17 DIAGNOSIS — M25512 Pain in left shoulder: Secondary | ICD-10-CM | POA: Diagnosis not present

## 2015-10-17 DIAGNOSIS — Z8639 Personal history of other endocrine, nutritional and metabolic disease: Secondary | ICD-10-CM

## 2015-10-17 DIAGNOSIS — M62838 Other muscle spasm: Secondary | ICD-10-CM

## 2015-10-17 MED ORDER — CYCLOBENZAPRINE HCL 5 MG PO TABS: 5.0000 mg | ORAL_TABLET | Freq: Three times a day (TID) | ORAL | Status: DC | PRN

## 2015-10-17 MED ORDER — COLCHICINE 0.6 MG PO TABS: ORAL_TABLET | ORAL | Status: DC

## 2015-10-17 NOTE — Progress Notes (Signed)
    MRN: [id number] DOB: November 03, 1970  Subjective:   Jimmy Hart is a 44 y.o. male presenting for chief complaint of Shoulder Pain  Reports 3 day history of shoulder pain. Has difficulty lifting arm due to shoulder pain. Also has upper back and neck pain and tightness. Patient [place]. Has not tried medications for relief. Denies trauma, lifting heavy objects, numbness or tingling, radiation of his pain, hearing popping or tearing noises. Admits history of gout in his toe, ankle, knee. He has cut back on his meat and alcohol consumption but still drinks daily. Denies history of shoulder injury or surgeries.  Jimmy Hart has a current medication list which includes the following prescription(s): acetaminophen, lisinopril, cephalexin, colchicine, and indomethacin. Also has No Known Allergies.  Jimmy Hart  has a past medical history of Hypertension and Gout. Also  has no past surgical history on file.  Objective:   Vitals: BP 134/90 mmHg  Pulse 83  Temp(Src) 98.3 F (36.8 C) (Oral)  Resp 18  Ht 5\' 5"  (1.651 m)  Wt 183 lb (83.008 kg)  BMI 30.45 kg/m2  SpO2 99%  Physical Exam  Constitutional: He is oriented to person, place, and time. He appears well-developed and well-nourished.  Cardiovascular: Normal rate.   Pulmonary/Chest: Effort normal.  Musculoskeletal:       Left shoulder: He exhibits decreased range of motion (lifting arm), tenderness (over AC joint) and spasm (over trapezius, L>R). He exhibits no swelling, no effusion, no crepitus, no deformity and no laceration.  Neurological: He is alert and oriented to person, place, and time.  Skin: Skin is warm and dry. No rash noted. No erythema. No pallor.   Dg Shoulder Left  10/17/2015  CLINICAL DATA:  44 year old male with left shoulder pain and difficulty with range of motion. EXAM: LEFT SHOULDER - 2+ VIEW COMPARISON:  None. FINDINGS: There is no evidence of fracture or dislocation. There is no  evidence of arthropathy or other focal bone abnormality. Soft tissues are unremarkable. IMPRESSION: Negative. Electronically Signed   By: Elgie CollardArash  Radparvar M.D.   On: 10/17/2015 18:17   Assessment and Plan :   1. Left shoulder pain - Unclear etiology, x-ray reassuring. May be due to overuse or gout. See #2, 3 & 4 below. Recheck in 3 days if no improvement. Will try Anaprox or meloxicam if his pain does not subside.  2. Trapezius muscle spasm - Start Flexeril for this.  3. Gouty arthritis 4. History of gout - Patient left prior to labs being drawn. I refilled his colchicine in case this is gouty pain since patient insisted this would help.   Jimmy BambergMario Shevonne Wolf, PA-C [facility] [phone] 10/17/2015 5:33 PM

## 2015-10-17 NOTE — Patient Instructions (Signed)
Dolor en el hombro (Shoulder Pain) El hombro es la articulacin que une los brazos al cuerpo. Los huesos que forman la articulacin del hombro son el hueso del brazo (hmero), el omplato (escpula) y la clavcula. La parte superior del hmero es similar a una bola y encaja en una cavidad ms bien plana en la escpula (cavidad glenoidea). Una combinacin de msculos y tejidos fuertes y fibrosos que conectan los msculos a los huesos (tendones) soportan la articulacin del hombro y mantienen la bola en la cavidad. En diferentes zonas de la articulacin hay pequeas bolsas llenas de lquido (bursa). Actan como amortiguadores entre los huesos y los tejidos blandos que recubren y ayudan a reducir la friccin entre los tendones y el hueso al mover el brazo. La articulacin del hombro permite una amplia gama de movimientos del brazo. Este rango de movimientos permite hacer diferentes cosas,como rascarse la espalda o lanzar una pelota. Sin embargo, esta amplitud de movimientos del hombro tambin lo hace ms propenso al dolor por uso excesivo y por lesiones. Las causas de dolor en el hombro pueden originarse tanto en las lesiones como en el uso excesivo y se pueden agrupar en las siguientes cuatro categoras:  Enrojecimiento, hinchazn y dolor (inflamacin) del tendn (tendinitis) o la bursa (bursitis).  Inestabilidad, como en la luxacin de la articulacin.  Inflamacin de la articulacin (artritis).  Un hueso roto (fractura). INSTRUCCIONES PARA EL CUIDADO EN EL HOGAR   Aplique hielo sobre la zona dolorida.  Ponga el hielo en una bolsa plstica.  Colquese una toalla entre la piel y la bolsa de hielo.  Deje el hielo durante 15 a 20minutos, 3 a 4veces por da durante los 2 primeros das, o segn las indicaciones del mdico.  Deje de usar compresas fras si no le alivian el dolor.  Si tiene un cabestrillo o inmovilizador de hombro, llvelo del modo en que su mdico le indique. Solo debe quitrselo  para ducharse o baarse. Mueva el brazo lo menos posible, pero mantenga la mano en movimiento para evitar la hinchazn.  Apriete una pelota blanda o una almohadilla de goma todo lo posible para evitar la hinchazn.  Utilice los medicamentos de venta libre o recetados para calmar el dolor, el malestar o la fiebre, segn se lo indique el mdico. SOLICITE ATENCIN MDICA SI:   El dolor en el hombro aumenta o siente un dolor nuevo en el brazo, la mano o los dedos.  La mano o los dedos estn fros y adormecidos.  El dolor no se alivia con los medicamentos. SOLICITE ATENCIN MDICA DE INMEDIATO SI:   El brazo, la mano o los dedos estn adormecidos o siente hormigueos.  El brazo, la mano o los dedos estn muy hinchados o se ven blancos o azules. ASEGRESE DE QUE:   Comprende estas instrucciones.  Controlar su afeccin.  Recibir ayuda de inmediato si no mejora o si empeora.   Esta informacin no tiene como fin reemplazar el consejo del mdico. Asegrese de hacerle al mdico cualquier pregunta que tenga.   Document Released: 07/14/2005 Document Revised: 10/25/2014 Elsevier Interactive Patient Education 2016 Elsevier Inc.  

## 2016-01-29 ENCOUNTER — Ambulatory Visit (INDEPENDENT_AMBULATORY_CARE_PROVIDER_SITE_OTHER): Payer: BLUE CROSS/BLUE SHIELD | Admitting: Physician Assistant

## 2016-01-29 VITALS — BP 126/76 | HR 84 | Temp 98.5°F | Resp 17 | Ht 65.0 in | Wt 175.0 lb

## 2016-01-29 DIAGNOSIS — M1009 Idiopathic gout, multiple sites: Secondary | ICD-10-CM | POA: Diagnosis not present

## 2016-01-29 DIAGNOSIS — M109 Gout, unspecified: Secondary | ICD-10-CM

## 2016-01-29 LAB — POCT CBC
Granulocyte percent: 71.9 %G (ref 37–80)
HCT, POC: 40.6 % — AB (ref 43.5–53.7)
HEMOGLOBIN: 14.9 g/dL (ref 14.1–18.1)
LYMPH, POC: 2.1 (ref 0.6–3.4)
MCH: 32.6 pg — AB (ref 27–31.2)
MCHC: 36.7 g/dL — AB (ref 31.8–35.4)
MCV: 88.6 fL (ref 80–97)
MID (cbc): 0.9 (ref 0–0.9)
MPV: 6.3 fL (ref 0–99.8)
PLATELET COUNT, POC: 321 10*3/uL (ref 142–424)
POC Granulocyte: 7.9 — AB (ref 2–6.9)
POC LYMPH PERCENT: 19.5 %L (ref 10–50)
POC MID %: 8.6 %M (ref 0–12)
RBC: 4.58 M/uL — AB (ref 4.69–6.13)
RDW, POC: 12.4 %
WBC: 11 10*3/uL — AB (ref 4.6–10.2)

## 2016-01-29 LAB — URIC ACID: URIC ACID, SERUM: 8.3 mg/dL — AB (ref 4.0–7.8)

## 2016-01-29 LAB — POCT SEDIMENTATION RATE: POCT SED RATE: 30 mm/h — AB (ref 0–22)

## 2016-01-29 MED ORDER — PREDNISONE 20 MG PO TABS: ORAL_TABLET | ORAL | Status: DC

## 2016-01-29 MED ORDER — COLCHICINE 0.6 MG PO TABS: ORAL_TABLET | ORAL | Status: DC

## 2016-01-29 NOTE — Patient Instructions (Addendum)
No shellfish. Cut way back on alcohol. These make gout worse Take colchicine 2 tabs today, then repeat 1 tab 1 hour later. Start prednisone today - 2 tabs in the mornings each day for 5 days, then stop. Return if not getting better in 1 week.  If you continue to get frequent gout attacks after changing your diet, return. We can start you on a medicine you would take every day.     IF you received an x-ray today, you will receive an invoice from [facility]. Please contact [facility] at [phone] with questions or concerns regarding your invoice.   IF you received labwork today, you will receive an invoice from [facility]. Please contact [facility] at [phone] with questions or concerns regarding your invoice.   Our billing staff will not be able to assist you with questions regarding bills from these companies.  You will be contacted with the lab results as soon as they are available. The fastest way to get your results is to activate your My Chart account. Instructions are located on the last page of this paperwork. If you have not heard from us regarding the results in 2 weeks, please contact this office.

## 2016-01-29 NOTE — Progress Notes (Signed)
[facility] [address], [place] [address] [phone]  Date:  01/29/2016   Name:  Jimmy Hart   DOB:  Aug 31, 1971   MRN:  [id number]  PCP:  No PCP Per Patient    Chief Complaint: Foot Pain   History of Present Illness:  This is a 45 y.o. male with PMH HTN and gout who is presenting with right foot pain that started 3 days ago. States when it started had just a little pain. The following day, no problems at all. During the night last night started having pain and swelling. Pain is located to medial right ankle and 1st MTP. He has been getting gout attacks for at least the past 6 years. Gets 3-4 attacks per year. He has had attacks in bilateral ankles and bilateral 1st MTPs. In the past colchicine has worked well for him but he is out of it now. Took 400 mg ibuprofen this morning, doesn't think it helped. Drinks 5 beers in a sitting, three times a week. States this week he has eaten a lot of shellfish and fish. Denies fever, chills.  Review of Systems:  Review of Systems See HPI  Patient Active Problem List   Diagnosis Date Noted  . Essential hypertension 05/01/2015    Prior to Admission medications   Medication Sig Start Date End Date Taking? Authorizing Provider  acetaminophen (TYLENOL) 325 MG tablet Take 650 mg by mouth every 6 (six) hours as needed. For pain   Yes Historical Provider, MD  cyclobenzaprine (FLEXERIL) 5 MG tablet Take 1-2 tablets (5-10 mg total) by mouth 3 (three) times daily as needed for muscle spasms. 10/17/15  Yes Wallis Bamberg, PA-C                  No Known Allergies  History reviewed. No pertinent past surgical history.  Social History  Substance Use Topics  . Smoking status: Current Every Day Smoker -- 0.50 packs/day for 18 years    Types: Cigarettes  . Smokeless tobacco: None  . Alcohol Use: 0.0 oz/week    0 Standard drinks or equivalent per week    History reviewed. No pertinent family history.  Medication list  has been reviewed and updated.  Physical Examination:  Physical Exam  Constitutional: He is oriented to person, place, and time. He appears well-developed and well-nourished. No distress.  HENT:  Head: Normocephalic and atraumatic.  Right Ear: Hearing normal.  Left Ear: Hearing normal.  Nose: Nose normal.  Eyes: Conjunctivae and lids are normal. Right eye exhibits no discharge. Left eye exhibits no discharge. No scleral icterus.  Pulmonary/Chest: Effort normal. No respiratory distress.  Musculoskeletal:       Right ankle: He exhibits decreased range of motion (mild, d/t pain). Tenderness. Achilles tendon normal.       Left ankle: Normal. Decreased range of motion: mild, d/t pain. Achilles tendon normal.       Right foot: There is tenderness. There is normal range of motion.       Left foot: Normal.  Right medial ankle with redness, mild swelling, tenderness Right 1st MTP with redness, mild swelling, tenderness Pedal pulses intact and symmetric  Neurological: He is alert and oriented to person, place, and time. Gait (antalgic) abnormal.  Skin: Skin is warm, dry and intact.  Psychiatric: He has a normal mood and affect. His speech is normal and behavior is normal. Thought content normal.   BP 126/76 mmHg  Pulse 84  Temp(Src) 98.5 F (  36.9 C) (Oral)  Resp 17  Ht 5\' 5"  (1.651 m)  Wt 175 lb (79.379 kg)  BMI 29.12 kg/m2  SpO2 98%  Results for orders placed or performed in visit on 01/29/16  POCT CBC  Result Value Ref Range   WBC 11.0 (A) 4.6 - 10.2 K/uL   Lymph, poc 2.1 0.6 - 3.4   POC LYMPH PERCENT 19.5 10 - 50 %L   MID (cbc) 0.9 0 - 0.9   POC MID % 8.6 0 - 12 %M   POC Granulocyte 7.9 (A) 2 - 6.9   Granulocyte percent 71.9 37 - 80 %G   RBC 4.58 (A) 4.69 - 6.13 M/uL   Hemoglobin 14.9 14.1 - 18.1 g/dL   HCT, POC 19.140.6 (A) 47.843.5 - 53.7 %   MCV 88.6 80 - 97 fL   MCH, POC 32.6 (A) 27 - 31.2 pg   MCHC 36.7 (A) 31.8 - 35.4 g/dL   RDW, POC 29.512.4 %   Platelet Count, POC 321 142 -  424 K/uL   MPV 6.3 0 - 99.8 fL   Assessment and Plan:   1. Gouty arthritis CBC with mild elevation wbc. Sed rate mildly elevated to 30. Uric acid pending. Prescribed colchicine to take at onset of gout attacks. 5 day course of prednisone prescribed. We discussed the importance of diet changes - significantly reduce alcohol consumption and stop eating shellfish. If still getting attacks with these diet changes, consider allopurinol daily for prevention. - POCT CBC - POCT SEDIMENTATION RATE - Uric Acid - colchicine 0.6 MG tablet; Take 2 now and one in one hour. Do not repeat for 3 days.  Dispense: 12 tablet; Refill: 1 - predniSONE (DELTASONE) 20 MG tablet; Take 2 tabs (40 mg) po QAM x 5 days.  Dispense: 10 tablet; Refill: 0   Roswell MinersNicole V. Dyke BrackettBush, PA-C, MHS [facility]  01/29/2016

## 2016-03-11 ENCOUNTER — Other Ambulatory Visit: Payer: Self-pay | Admitting: Urgent Care

## 2016-03-11 ENCOUNTER — Ambulatory Visit (INDEPENDENT_AMBULATORY_CARE_PROVIDER_SITE_OTHER): Payer: BLUE CROSS/BLUE SHIELD | Admitting: Urgent Care

## 2016-03-11 ENCOUNTER — Ambulatory Visit (INDEPENDENT_AMBULATORY_CARE_PROVIDER_SITE_OTHER): Payer: BLUE CROSS/BLUE SHIELD

## 2016-03-11 VITALS — BP 120/82 | HR 84 | Temp 98.1°F | Resp 16 | Ht 64.0 in | Wt 175.4 lb

## 2016-03-11 DIAGNOSIS — M25561 Pain in right knee: Secondary | ICD-10-CM | POA: Diagnosis not present

## 2016-03-11 DIAGNOSIS — M1009 Idiopathic gout, multiple sites: Secondary | ICD-10-CM | POA: Diagnosis not present

## 2016-03-11 DIAGNOSIS — M10061 Idiopathic gout, right knee: Secondary | ICD-10-CM

## 2016-03-11 DIAGNOSIS — M25461 Effusion, right knee: Secondary | ICD-10-CM | POA: Diagnosis not present

## 2016-03-11 DIAGNOSIS — M109 Gout, unspecified: Secondary | ICD-10-CM

## 2016-03-11 LAB — COMPLETE METABOLIC PANEL WITH GFR
ALBUMIN: 4.2 g/dL (ref 3.6–5.1)
ALK PHOS: 67 U/L (ref 40–115)
ALT: 17 U/L (ref 9–46)
AST: 15 U/L (ref 10–40)
BUN: 9 mg/dL (ref 7–25)
CO2: 29 mmol/L (ref 20–31)
Calcium: 9.8 mg/dL (ref 8.6–10.3)
Chloride: 100 mmol/L (ref 98–110)
Creat: 0.78 mg/dL (ref 0.60–1.35)
GFR, Est African American: >89 mL/min (ref >=60)
GFR, Est Non African American: >89 mL/min (ref >=60)
GLUCOSE: 93 mg/dL (ref 65–99)
POTASSIUM: 4.5 mmol/L (ref 3.5–5.3)
Sodium: 141 mmol/L (ref 135–146)
Total Bilirubin: 0.5 mg/dL (ref 0.2–1.2)
Total Protein: 7.3 g/dL (ref 6.1–8.1)

## 2016-03-11 LAB — POCT CBC
GRANULOCYTE PERCENT: 66.1 % (ref 37–80)
HEMATOCRIT: 41.2 % — AB (ref 43.5–53.7)
HEMOGLOBIN: 14.6 g/dL (ref 14.1–18.1)
Lymph, poc: 2.3 (ref 0.6–3.4)
MCH: 31.2 pg (ref 27–31.2)
MCHC: 35.4 g/dL (ref 31.8–35.4)
MCV: 88.3 fL (ref 80–97)
MID (CBC): 0.7 (ref 0–0.9)
MPV: 6.4 fL (ref 0–99.8)
POC GRANULOCYTE: 5.8 (ref 2–6.9)
POC LYMPH PERCENT: 26.3 %L (ref 10–50)
POC MID %: 7.6 % (ref 0–12)
Platelet Count, POC: 374 10*3/uL (ref 142–424)
RBC: 4.67 M/uL — AB (ref 4.69–6.13)
RDW, POC: 12.7 %
WBC: 8.7 10*3/uL (ref 4.6–10.2)

## 2016-03-11 MED ORDER — COLCHICINE 0.6 MG PO TABS: ORAL_TABLET | ORAL | Status: DC

## 2016-03-11 NOTE — Patient Instructions (Addendum)
Colchicine - Tome 2 pastillas Rwandaahorita y 1 en Georgianne Fickuna hora. Espere 3 dias. Despues tome 1 pastilla dos veces al dia.   Gota  (Gout)  La gota es una artritis inflamatoria originada por la acumulacin de cristales de cido rico en las articulaciones. El cido rico es una sustancia qumica que normalmente se encuentra en la Damiansvillesangre. Cuando los niveles de cido rico en la sangre son muy elevados, pueden formarse cristales que se depositan en las articulaciones y los tejidos. Esto causa irritacin, dolor e hinchazn (inflamacin). La repeticin de los ataques es frecuente. Con el tiempo, los cristales de cido rico pueden formar Toys ''R'' Usmasas (tofos) cerca de Risk analystuna articulacin, destruyen el Alpinehueso y provocan una deformidad La gota es una enfermedad que puede tratarse y con frecuencia puede prevenirse. CAUSAS  La enfermedad comienza con niveles altos de cido rico en la sangre. El organismo produce cido rico cuando metaboliza una sustancia que se encuentra en Cross Timbersestado natural, denominada purina. Ciertos alimentos, como carnes y pescado, contienen grandes cantidades de purinas. Las causas de cido rico elevado son:   Ser transmitida de padres a hijos (hereditaria).  Enfermedades que causan un aumento de la produccin de cido rico (como la obesidad, la psoriasis y ciertos tipos de Database administratorcncer).  Abuso en el consumo de bebidas alcohlicas.  La dieta, especialmente las Ryder Systemdietas en las que se consume Iranmucha carne y frutos de mar.  Ciertos medicamentos, como los que combaten el cncer (quimioterapia), los diurticos y la aspirina.  Enfermedades renales crnicas. Los riones no pueden eliminar bien el cido rico.  Problemas con el metabolismo. Las enfermedades fuertemente asociadas a la gota son:   Janene HarveyObesidad.  Hipertensin arterial.  Colesterol elevado.  Diabetes. No todas las personas con niveles elevados de cido rico sufren gota. No se comprende an por qu algunas personas padecen gota y otras no. Las  Edgertoncirugas, lesiones en una articulacin y el consumo excesivo de ciertos alimentos son algunos de los factores que pueden provocar ataques de Hudsonvillegota.  SNTOMAS   Un ataque de gota puede comenzar rpidamente. Causa un dolor intenso, con irritacin, hinchazn y calor en una articulacin.  Puede haber fiebre.  Generalmente slo una articulacin es Amesafectada. Ciertas articulaciones se ven implicadas con ms frecuencia.  La base del dedo gordo del pie.  La rodilla.  El tobillo.  Webb LawsLa mueca.  Un dedo. Sin tratamiento, un ataque por lo general desaparece en unos pocos das o semanas. Entre un ataque y otro, por lo general no hay sntomas, lo cual es diferente de Atkinsportmuchas otras formas de artritis.  DIAGNSTICO  El profesional reunir la informacin basndose en los sntomas y el examen fsico. En algunos casos indicar estudios. Estos estudios pueden ser:   Anlisis de Amsterdamsangre.  Anlisis de Comorosorina.  Radiografas.  Anlisis de los fluidos de Nurse, learning disabilityla articulacin. En este estudio se necesita una aguja para extraer lquido de la articulacin (artrocentesis). Con el uso del microscopio, se confirma la gota cuando se observan los cristales de cido rico en el lquido de Nurse, learning disabilityla articulacin. TRATAMIENTO  ONEOKHay dos fases en el tratamiento de la gota: el tratamiento del ataque de aparicin repentina (agudo) y la prevencin de los ataques (profilaxis).   Tratamiento de un ataque agudo.  Se utilizan medicamentos. Se indican antiinflamatorios o corticoides.  En algunos casos es necesario inyectar un corticoide en la articulacin afectada.  La articulacin dolorosa se pone en reposo. El movimiento puede empeorar la artritis.  Puede utilizar tanto tratamientos con calor o con fro para Amgen Incaliviar  el dolor en las articulaciones, segn lo que le haga mejor.  Tratamiento para prevenir ataques.  Si sufre de ataques de gota frecuentes, el mdico puede recomendarle medicamentos preventivos. Estos medicamentos se  administran despus de que el ataque agudo Ajo. Estos medicamentos ayudan a los riones a Land cido rico del organismo o a Conservator, museum/gallery produccin de cido rico. Es posible que deba Chemical engineer estos medicamentos durante un largo [place].  La primera fase del tratamiento preventiva puede asociarse con un aumento de los ataques agudos de [place]. Por esta razn, durante los primeros meses de [place], [place] mdico puede tambin aconsejarle que tome medicamentos que habitualmente se [place] para el tratamiento de la gota aguda. Asegrese de comprender todas las indicaciones del mdico. El mdico podr hacer varios ajustes a la dosis del medicamento antes de que comiencen a ser Geologist, engineering.  Comente el tratamiento diettico con su mdico o nutricionista. El alcohol y las bebidas que contienen gran cantidad de International aid/development worker y fructosa y los alimentos como la carne, el pollo y los frutos de mar pueden aumentar los niveles de cido rico. El mdico o el nutricionista podr [employer] las bebidas y los alimentos que debe limitar. INSTRUCCIONES PARA EL CUIDADO EN EL HOGAR   No tome aspirina para el dolor. Esto eleva los niveles de cido rico.  Slo tome medicamentos de venta libre o prescriptos para Primary school teacher, las molestias o Publishing copy la fiebre segn las indicaciones de su mdico.  Sports administrator reposo todo el tiempo que pueda. Cuando se encuentre en la cama, mantenga las sbanas y [address] alejadas de las articulaciones doloridas.  Mantenga la articulacin afectada levantada (elevada).  Aplique compresas fras o calientes sobre las articulaciones doloridas. el uso de compresas calientes o fras depende de lo que [employer] resulte a usted.  Utilice muletas si la articulacin que le duele es de la pierna.  Debe ingerir gran cantidad de lquido para mantener la orina de tono claro o color amarillo plido. Esto ayudar a que el organismo elimine el cido rico. Limite el consumo de alcohol, bebidas azucaradas y  que contengan fructosa.  Siga las indicaciones con respecto a la dieta. Preste especial atencin a la cantidad de protenas que consume. En la dieta diaria debe enfatizar el consumo de frutas, vegetales, granos enteros y productos lcteos descremados. Comente con su mdico o nutricionista acerca del consumo de caf, vitamina C o cerezas. Estos pueden ayudar a [employer] de cido rico.  Mantenga un peso corporal adecuado. SOLICITE ATENCIN MDICA SI:   Tiene diarrea, vmitos o algn efecto secundario provocado por los medicamentos.  No se siente mejor en 24 horas, o empeora. SOLICITE ATENCIN MDICA DE INMEDIATO SI:   Las articulaciones le duelen ms de manera repentina, tiene escalofros o fiebre. ASEGRESE DE QUE:   Comprende estas instrucciones.  Controlar su enfermedad.  Solicitar ayuda de inmediato si no mejora o si empeora.   Esta informacin no tiene Theme park manager el consejo del mdico. Asegrese de hacerle al mdico cualquier pregunta que tenga.   Document Released: 07/14/2005 Document Revised: 01/29/2013 Elsevier Interactive Patient Education 2016 [employer].    Colchicine tablets or capsules Qu es este medicamento? La COLCHICINA se Clinical biochemist del dolor y la hinchazn de las articulaciones de la artritis gotosa aguda. Este medicamento tambin se [place] para tratar Engineer, production. Este medicamento puede ser utilizado para otros usos; si tiene alguna pregunta consulte con su proveedor de atencin mdica o con su  farmacutico. Qu le debo informar a mi profesional de la salud antes de tomar este medicamento? Necesita saber si usted presenta alguno de los [employer] o situaciones: -anemia -trastornos sanguneos, como leucemia o linfoma -enfermedad cardiaca -problemas del sistema inmunolgico -enfermedad intestinal -enfermedad renal -enfermedad heptica -dolor o debilidad muscular -toma otros  medicamentos -problemas de estmago -una reaccin alrgica o inusual a la colchicina, a otros medicamentos, lactosa, alimentos, colorantes o conservantes -si est embarazada o buscando quedar embarazada -si est amamantando a un beb Cmo debo utilizar este medicamento? Tome este medicamento por va oral con un vaso lleno de agua. Siga las instrucciones de la etiqueta del [place]. Puede tomarlo con o sin alimentos. Si el Social worker, tmelo con alimentos. Tome sus dosis a intervalos regulares. No tome su medicamento con una frecuencia mayor a la indicada. Su farmacutico le dar una Gua del medicamento especial con cada receta y relleno. Asegrese de leer esta informacin cada vez cuidadosamente. Hable con su pediatra para informarse acerca del uso de este medicamento en nios. Aunque este medicamento ha sido recetado a nios tan menores como de 4 aos de edad para condiciones selectivas, las precauciones se aplican. Los pacientes de ms de 65 aos de edad pueden presentar reacciones ms fuertes y Pension scheme manager dosis [employer]. Sobredosis: Pngase en contacto inmediatamente con un centro toxicolgico o una sala de urgencia si usted cree que haya tomado demasiado medicamento. ATENCIN: [employer] es solo para usted. No comparta este medicamento con nadie. Qu sucede si me olvido de una dosis? Si olvida una dosis, tmela lo antes posible. Si es casi la hora de la prxima dosis, tome slo esa dosis. No tome dosis adicionales o dobles. Qu puede interactuar con este medicamento? No tome esta medicina con ninguno de los siguientes medicamentos: -ciertos medicamentos para las Therapist, music, tales como itraconazol Esta medicina tambin puede interactuar con los siguientes medicamentos: -alcohol -ciertos medicamentos para el colesterol, tales como atorvastatina -ciertos medicamentos para la tos y resfros -ciertos medicamentos para facilitar su  respiracin -ciclosporina -digoxina -epinefrina -toronja o jugo de toronja -metenamina -otros medicamentos para las infecciones micticas -bicarbonato de sodio -algunos antibiticos tales como claritromicina, eritromicina y telitromicina -algunos medicamentos para el pulso cardiaco irregular u otros problemas cardiacos -algunos medicamentos para Management consultant, como lapatinib y tamoxifeno -algunos medicamentos para el VIH Puede ser que esta lista no menciona todas las posibles interacciones. Informe a su profesional de [employer] de [employer] Inc productos a base de hierbas, medicamentos de [place] o suplementos nutritivos que est tomando. Si usted fuma, consume bebidas alcohlicas o si utiliza drogas ilegales, indqueselo tambin a su profesional de [employer]. Algunas sustancias pueden interactuar con su medicamento. A qu debo estar atento al usar PPL Corporation? Visite a su mdico o a su profesional de la salud para chequear su evolucin peridicamente. Tendr que hacerse anlisis de sangre peridicamente. El alcohol puede aumentar la posibilidad de tener problemas estomacales y ataques de [place]. No consume alcohol. Qu efectos secundarios puedo tener al [employer] Scientific este medicamento? Efectos secundarios que debe informar a su mdico o a Producer, television/film/video de la salud tan pronto como sea posible: -Therapist, art como erupcin cutnea, picazn o urticarias, hinchazn de la cara, labios o lengua -fiebre, escalofros o dolor de garganta -sensibilidad, dolor o debilidad muscular -hormigueo o entumecimiento de manos o pies -magulladuras o sangrado inusuales -cansancio o debilidad inusual -vmito Efectos secundarios que, por lo general, no requieren atencin mdica (debe informarlos a su mdico o a su  profesional de la salud si persisten o si son molestos): -diarrea -cada del cabello -prdida del apetito -dolor de Teaching laboratory technician o nuseas Puede ser que esta lista no menciona todos los posibles  efectos secundarios. Comunquese a su mdico por asesoramiento mdico [employer]lett[employer]ard. Usted puede informar los efectos secundarios a la FDA por telfono al 1-800-FDA-1088. Dnde debo guardar mi medicina? Mantngala fuera del alcance de los nios. Gurdela a Sanmina[employer]SCI, entre 15 y 30 grados C (57 y 25 grados F). Mantenga el envase bien cerrado. Protjalo de Statistician. Deseche todo el medicamento que no haya utilizado, despus de la fecha de vencimiento. ATENCIN: Este folleto es un resumen. Puede ser que no cubra toda la posible informacin. Si usted tiene preguntas acerca de esta medicina, consulte con su mdico, su farmacutico o su profesional de Radiographer, therapeutic.    2016, Elsevier/Gold Standard. (2014-11-26 00:00:00)     IF you received an x-ray today, you will receive an invoice from [facility]. Please contact [facility] at [phone] with questions or concerns regarding your invoice.   IF you received labwork today, you will receive an invoice from [employer]. Please contact [facility] at [phone] with questions or concerns regarding your invoice.   Our billing staff will not be able to assist you with questions regarding bills from these companies.  You will be contacted with the lab results as soon as they are available. The fastest way to get your results is to activate your My Chart account. Instructions are located on the last page of this paperwork. If you have not heard from [place] regarding the results in 2 weeks, please contact this office.

## 2016-03-11 NOTE — Progress Notes (Signed)
MRN: [id number] DOB: 1970/11/16  Subjective:   Jimmy Hart is a 45 y.o. male presenting for chief complaint of Knee Pain  Reports several month history of daily right knee pain, swelling, redness of his right knee, also has right thigh pain. Works at [employer], is very active. Has a history of gout, has had some relief with colchicine. Reports that he has continued to have gout of his right great toe, ankle. Denies trauma, numbness or tingling, knee buckling, fever, chest pain, cough, n/v, abdominal pain.   Jimmy Hart has a current medication list which includes the following prescription(s): lisinopril and colchicine. Also has No Known Allergies.  Jimmy Hart  has a past medical history of Hypertension and Gout. Also  has no past surgical history on file.  Objective:   Vitals: BP 120/82 mmHg  Pulse 84  Temp(Src) 98.1 F (36.7 C)  Resp 16  Ht  (1.626 m)  Wt 175 lb 6.4 oz (79.561 kg)  BMI 30.09 kg/m2  SpO2 99%  Physical Exam  Constitutional: He is oriented to person, place, and time. He appears well-developed and well-nourished.  Cardiovascular: Normal rate.   Pulmonary/Chest: Effort normal.  Musculoskeletal:       Right knee: He exhibits decreased range of motion (extension, flexion), swelling (over entire knee) and erythema (slight with associated warmth). He exhibits no ecchymosis, no deformity, no laceration, normal patellar mobility and no bony tenderness. Tenderness found. Medial joint line, lateral joint line and patellar tendon tenderness noted.       Right ankle: He exhibits swelling (lateral malleolus). He exhibits normal range of motion, no ecchymosis and no deformity. Tenderness. Lateral malleolus tenderness found. No medial malleolus, no AITFL and no proximal fibula tenderness found. Achilles tendon exhibits no pain and no defect.       Right foot: There is tenderness (over 1st MTP), bony tenderness and swelling (over 1st MTP). There is normal range of motion,  normal capillary refill, no crepitus, no deformity and no laceration.  Neurological: He is alert and oriented to person, place, and time.  Skin: Skin is warm and dry.   Dg Knee Complete 4 Views Right  03/11/2016  CLINICAL DATA:  Knee pain for several months.  Swelling and redness. EXAM: RIGHT KNEE - COMPLETE 4+ VIEW COMPARISON:  None. FINDINGS: Tricompartmental degenerative change, worst in lateral compartment. No fracture or dislocation. Large suprapatellar bursa effusion. IMPRESSION: Large effusion.  Degenerative joint disease. With a history of swelling and redness, if there is concern for septic arthritis, fluid sampling could be performed. Electronically Signed   By: Elsie Stain M.D.   On: 03/11/2016 10:27   Results for orders placed or performed in visit on 03/11/16 (from the past 24 hour(s))  POCT CBC     Status: Abnormal   Collection Time: 03/11/16 10:33 AM  Result Value Ref Range   WBC 8.7 4.6 - 10.2 K/uL   Lymph, poc 2.3 0.6 - 3.4   POC LYMPH PERCENT 26.3 10 - 50 %L   MID (cbc) 0.7 0 - 0.9   POC MID % 7.6 0 - 12 %M   POC Granulocyte 5.8 2 - 6.9   Granulocyte percent 66.1 37 - 80 %G   RBC 4.67 (A) 4.69 - 6.13 M/uL   Hemoglobin 14.6 14.1 - 18.1 g/dL   HCT, POC 40.9 (A) 81.1 - 53.7 %   MCV 88.3 80 - 97 fL   MCH, POC 31.2 27 - 31.2 pg   MCHC 35.4 31.8 -  35.4 g/dL   RDW, POC 47.812.7 %   Platelet Count, POC 374 142 - 424 K/uL   MPV 6.4 0 - 99.8 fL   Assessment and Plan :   1. Acute gout of right knee, unspecified cause 2. Right knee pain 3. Knee swelling, right 4. Gouty arthritis - Likely undergoing gout attack. CBC and HPI reassuring against septic arthritis. Discussed management of gout with colchicine, starting prophylaxis with colchicine thereafter and considering allopurinol in the future. Patient was in agreement. Will rtc in 2 weeks for recheck or sooner if no improvement.  Wallis BambergMario Stori Royse, PA-C [facility] [phone] 03/11/2016 9:58 AM

## 2016-03-13 LAB — URIC ACID: Uric Acid, Serum: 7.3 mg/dL (ref 4.0–7.8)

## 2016-03-16 ENCOUNTER — Telehealth: Payer: Self-pay | Admitting: Urgent Care

## 2016-03-16 MED ORDER — PREDNISONE 20 MG PO TABS: ORAL_TABLET | ORAL | Status: DC

## 2016-03-16 NOTE — Telephone Encounter (Signed)
Patient notes some improvement but his knee is still swollen. He is currently taking colchicine BID. His uric acid was upper limit of normal. He is to rtc if short steroid course does not help patient. Reported normal cmet.

## 2016-03-29 ENCOUNTER — Ambulatory Visit (INDEPENDENT_AMBULATORY_CARE_PROVIDER_SITE_OTHER): Payer: BLUE CROSS/BLUE SHIELD | Admitting: Physician Assistant

## 2016-03-29 VITALS — BP 120/74 | HR 76 | Temp 97.8°F | Resp 17 | Ht 64.0 in | Wt 176.0 lb

## 2016-03-29 DIAGNOSIS — M25461 Effusion, right knee: Secondary | ICD-10-CM

## 2016-03-29 DIAGNOSIS — M25561 Pain in right knee: Secondary | ICD-10-CM

## 2016-03-29 MED ORDER — MELOXICAM 7.5 MG PO TABS: 7.5000 mg | ORAL_TABLET | Freq: Every day | ORAL | Status: AC

## 2016-03-29 NOTE — Progress Notes (Signed)
[facility] [address], [place] [address] [phone]  Date:  03/29/2016   Name:  Jimmy Hart   DOB:  November 11, 1970   MRN:  [id number]  PCP:  No PCP Per Patient    History of Present Illness:  Jimmy Hart is a 45 y.o. male patient who presents to [facility] for cc of right knee pain.  Pain on the lateral side of right knee, which appears different from his prior swelling where he was seen 3 weeks ago for several month knee swelling.  He was treated with colchicine.  Patient states that this had improved, but within the last 3 days, he has appreciable pain at the right knee.  There was no redness.  He can not full straighten his leg.  He is also having great difficulty bearing any weight due to the pain with raising his knee.  He denies instability, or weakness.     Patient Active Problem List   Diagnosis Date Noted  . Gouty arthritis 01/29/2016  . Essential hypertension 05/01/2015    Past Medical History  Diagnosis Date  . Hypertension   . Gout     No past surgical history on file.  Social History  Substance Use Topics  . Smoking status: Current Every Day Smoker -- 0.50 packs/day for 18 years    Types: Cigarettes  . Smokeless tobacco: None  . Alcohol Use: 0.0 oz/week    0 Standard drinks or equivalent per week    No family history on file.  No Known Allergies  Medication list has been reviewed and updated.  Current Outpatient Prescriptions on File Prior to Visit  Medication Sig Dispense Refill  . colchicine 0.6 MG tablet Take 2 now and one in one hour. Do not repeat for 3 days. 60 tablet 3  . lisinopril (PRINIVIL,ZESTRIL) 20 MG tablet Take 1 tablet (20 mg total) by mouth daily. 90 tablet 3  . predniSONE (DELTASONE) 20 MG tablet Take 2 tablets daily with breakfast. 10 tablet 0   No current facility-administered medications on file prior to visit.    ROS ROS otherwise unremarkable unless listed above.   Physical Examination: BP  120/74 mmHg  Pulse 76  Temp(Src) 97.8 F (36.6 C) (Oral)  Resp 17  Ht  (1.626 m)  Wt 176 lb (79.833 kg)  BMI 30.20 kg/m2  SpO2 99% Ideal Body Weight: Weight in (lb) to have BMI = 25: 145.3  Physical Exam  Constitutional: He is oriented to person, place, and time. He appears well-developed and well-nourished. No distress.  HENT:  Head: Normocephalic and atraumatic.  Eyes: Conjunctivae and EOM are normal. Pupils are equal, round, and reactive to light.  Cardiovascular: Normal rate.   Pulmonary/Chest: Effort normal. No respiratory distress.  Musculoskeletal:       Right knee: He exhibits decreased range of motion (pain incited with flexion), swelling (right posterior-lateral), deformity and abnormal patellar mobility. He exhibits no LCL laxity, normal meniscus and no MCL laxity. Tenderness found. Lateral joint line tenderness noted. No medial joint line tenderness noted.  No erythema.  Slight warmth to the lateral side of the knee.  Valgus posturing with standing.  Neurological: He is alert and oriented to person, place, and time.  Skin: Skin is warm and dry. He is not diaphoretic.  Psychiatric: He has a normal mood and affect. His behavior is normal.    Assessment and Plan: [facility] 32 [facility]  pain. This appears different from the gouty flare.  Possibility of arthritic changes secondary to inflammation of joint.   Given RA brace to compensate lateral right side.  Icing three times per day.  mobic given.  precautions advised. -placed referral to see Jimmy Hart ortho in 7 days at 8:15am.  Right knee pain - Plan: meloxicam (MOBIC) 7.5 MG tablet, AMB referral to orthopedics  Knee swelling, right - Plan: AMB referral to orthopedics  Trena PlattStephanie Erman Thum, PA-C [facility] 03/29/2016 10:54 AM

## 2016-03-29 NOTE — Patient Instructions (Addendum)
     IF you received an x-ray today, you will receive an invoice from [facility]. Please contact [facility] at [phone] with questions or concerns regarding your invoice.   IF you received labwork today, you will receive an invoice from [facility]. Please contact [facility] at [phone] with questions or concerns regarding your invoice.   Our billing staff will not be able to assist you with questions regarding bills from these companies.  You will be contacted with the lab results as soon as they are available. The fastest way to get your results is to activate your My Chart account. Instructions are located on the last page of this paperwork. If you have not heard from us regarding the results in 2 weeks, please contact this office.     Please ice the knee three times per day for 15 minutes. Please take the mobic as prescribed.  Do not take ibuprofen, or naproxen with this medication.   You have an appointment on Monday April 05, 2016 [facility] at 8:15am.  Please be there at 7:45am.

## 2016-10-18 IMAGING — CR DG FINGER LITTLE 2+V*R*
3 series · 3 of 3 positions shown · non-contrast
Comparison: None.

CLINICAL DATA: Finger caught in lawnmower blade

EXAM:
RIGHT FIFTH FINGER 2+V

[PA]
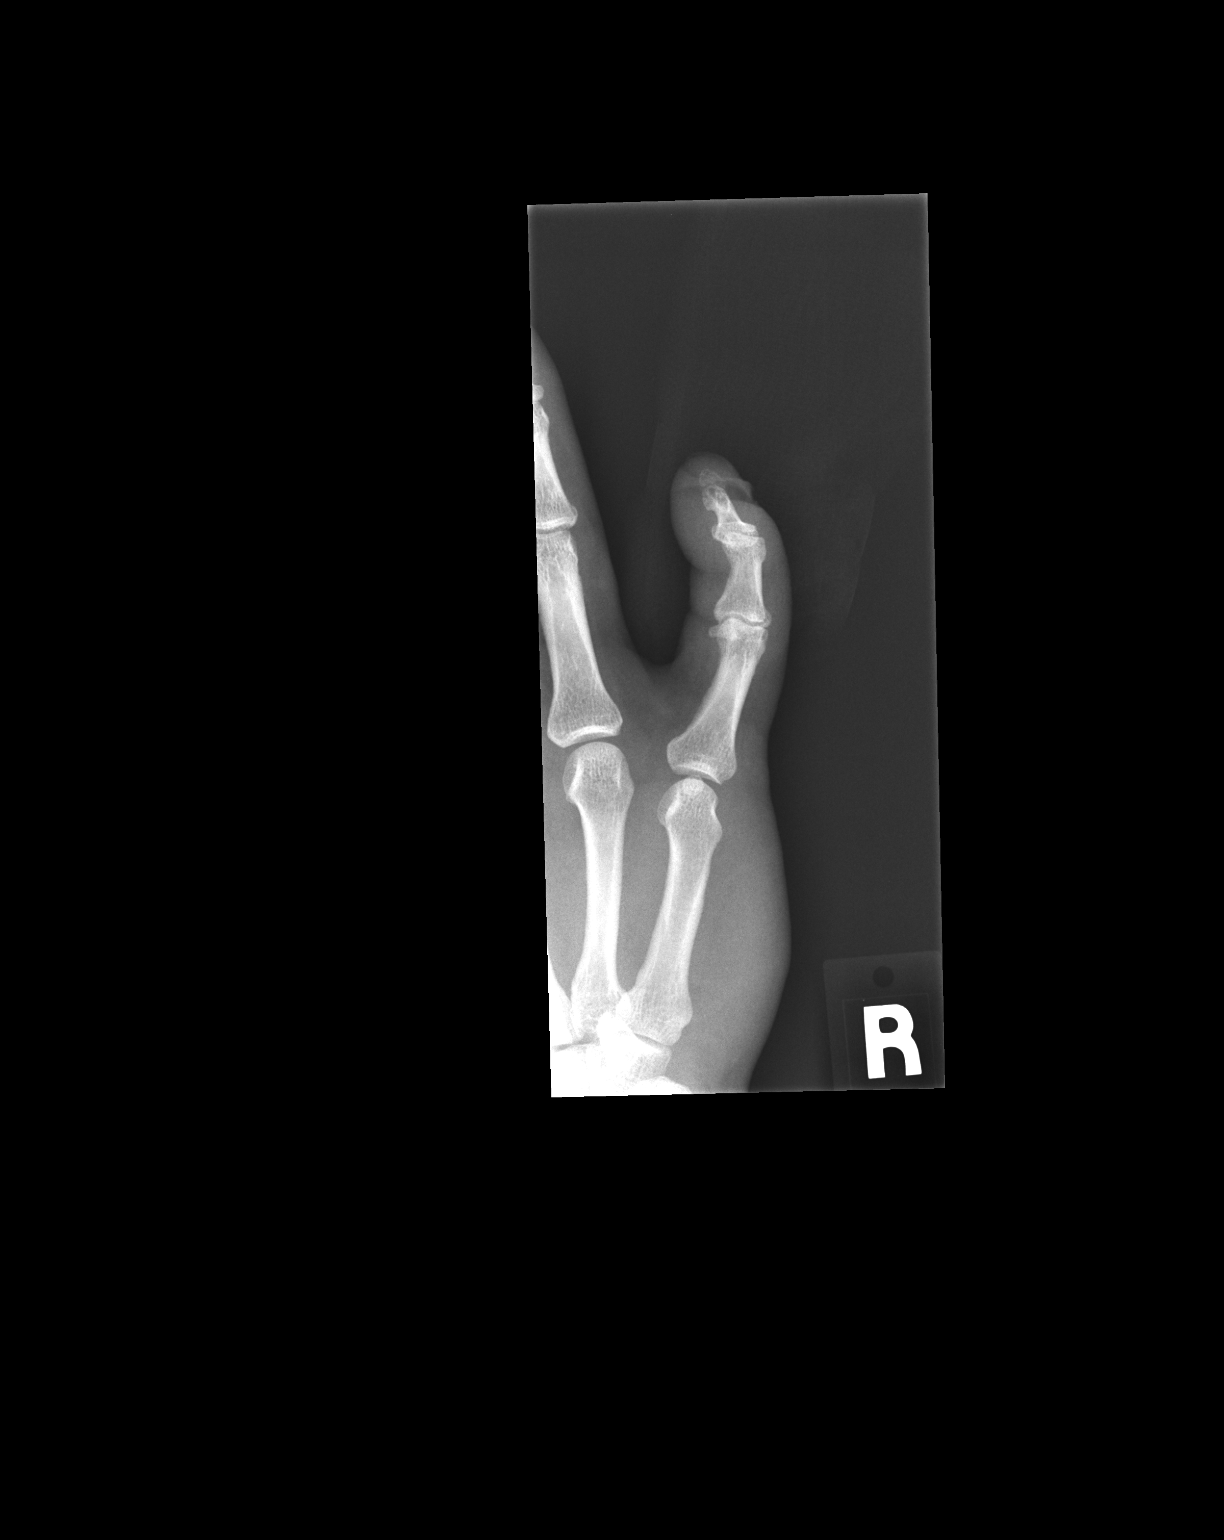

[pa obl]
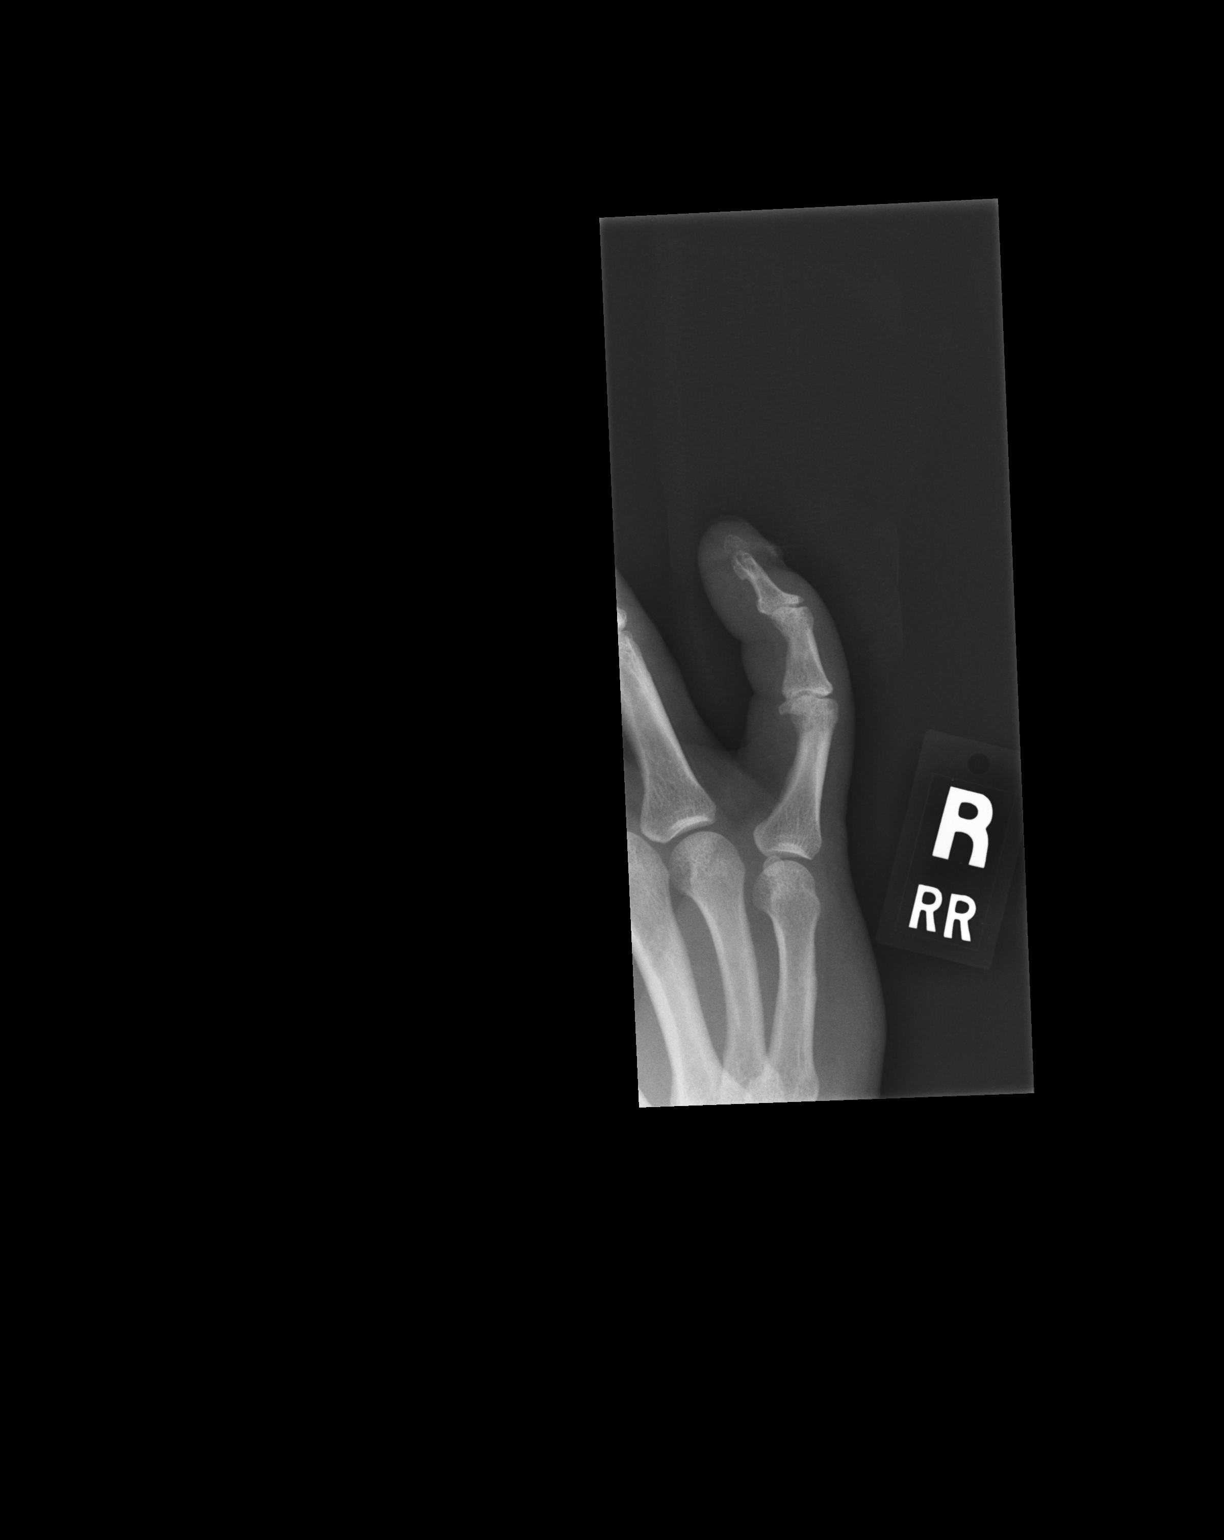

[lateral]
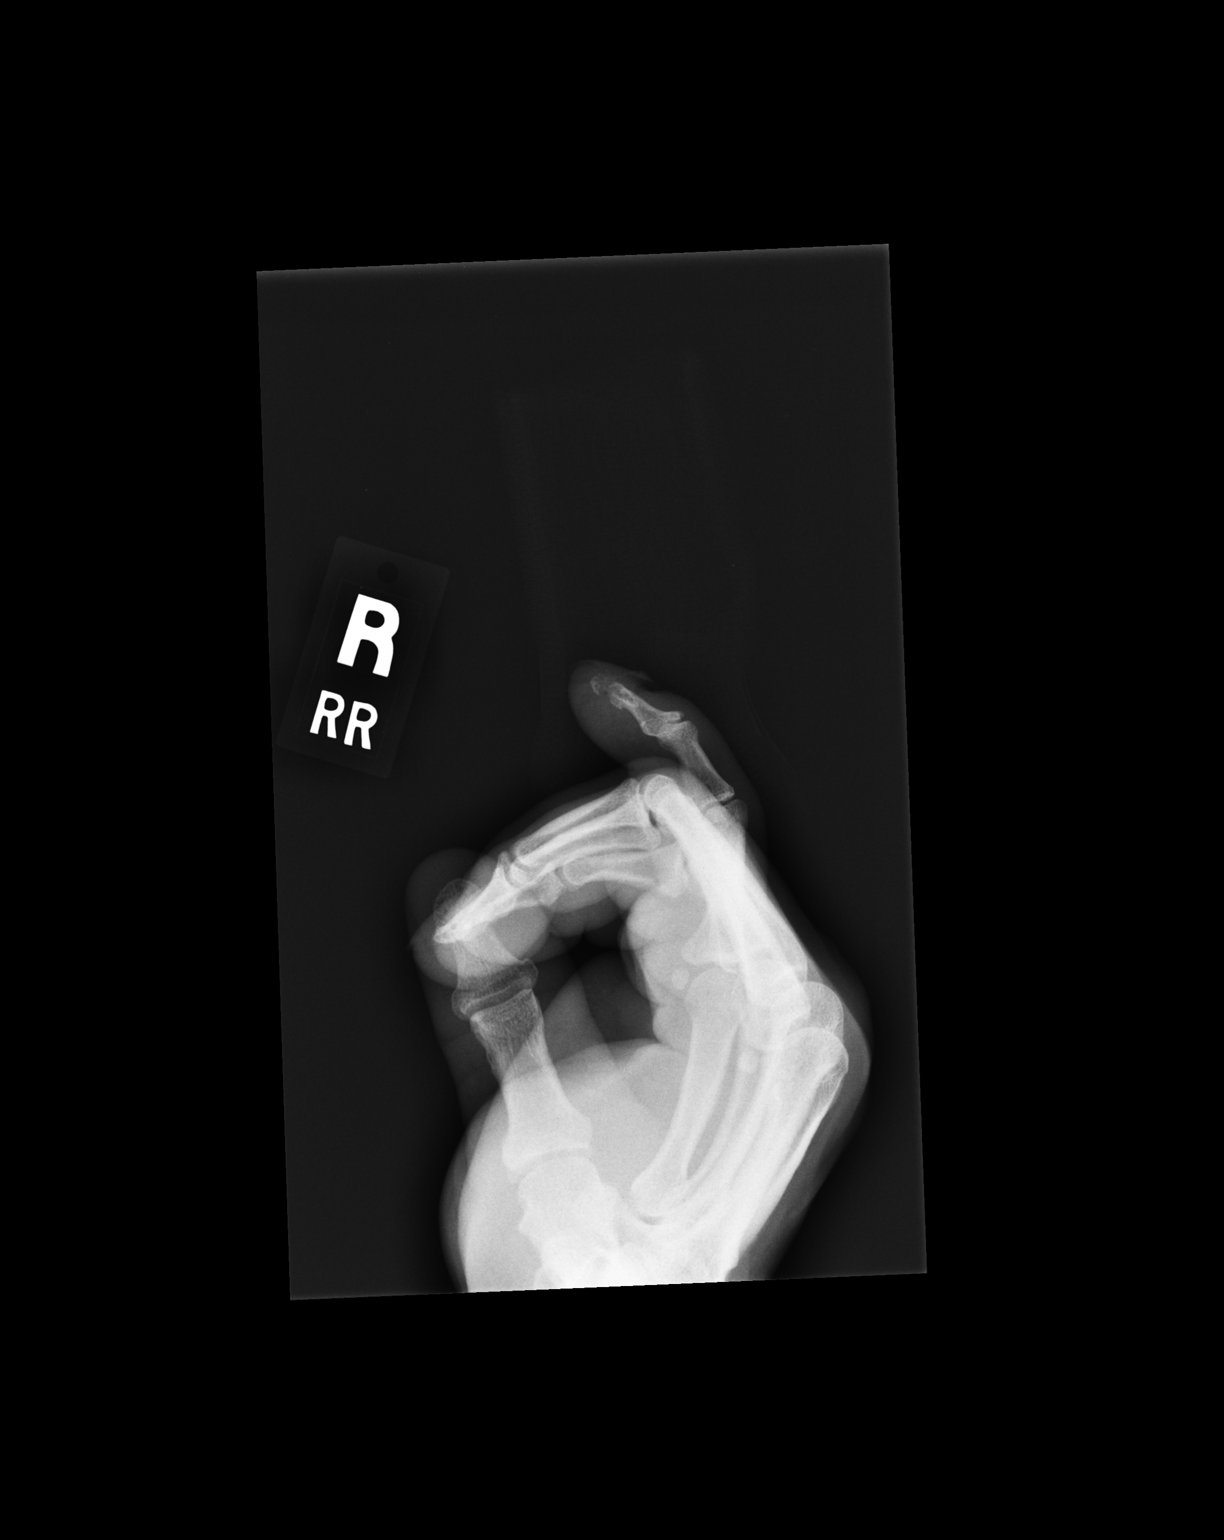

[3 of 3 positions shown; findings below may reference images not displayed]

FINDINGS: Frontal, oblique, and lateral views were obtained. There is a
fracture of the distal aspect of the fifth distal phalanx with
extensive soft tissue injury in this area. There is mild
displacement of the fracture fragments in the distal aspect of the
fifth distal phalanx. No other fracture. No dislocation. There is
mild osteoarthritic change in the fifth DIP joint. No radiopaque
foreign body.
IMPRESSION: Fracture distal aspect fifth distal phalanx with soft tissue injury.
This fracture must be regarded as an open fracture. No dislocation.
Slight narrowing fifth DIP joint.

## 2021-11-17 DIAGNOSIS — M109 Gout, unspecified: Secondary | ICD-10-CM | POA: Diagnosis not present

## 2021-12-04 ENCOUNTER — Encounter (HOSPITAL_COMMUNITY): Payer: Self-pay

## 2021-12-04 ENCOUNTER — Ambulatory Visit (HOSPITAL_COMMUNITY)
Admission: EM | Admit: 2021-12-04 | Discharge: 2021-12-04 | Disposition: A | Discharge status: Home / Self Care | Payer: 59 | Attending: Student | Admitting: Student

## 2021-12-04 ENCOUNTER — Other Ambulatory Visit: Payer: Self-pay

## 2021-12-04 DIAGNOSIS — M109 Gout, unspecified: Secondary | ICD-10-CM

## 2021-12-04 DIAGNOSIS — I10 Essential (primary) hypertension: Secondary | ICD-10-CM

## 2021-12-04 MED ORDER — COLCHICINE 0.6 MG PO TABS: 0.6000 mg | ORAL_TABLET | Freq: Every day | ORAL | 0 refills | Status: DC

## 2021-12-04 MED ORDER — METHYLPREDNISOLONE SODIUM SUCC 125 MG IJ SOLR
INTRAMUSCULAR | Status: AC
  Filled 2021-12-04: qty 2

## 2021-12-04 MED ORDER — METHYLPREDNISOLONE SODIUM SUCC 125 MG IJ SOLR
60.0000 mg | Freq: Once | INTRAMUSCULAR | Status: AC
  Administered 2021-12-04: 60 mg via INTRAMUSCULAR

## 2021-12-04 NOTE — Discharge Instructions (Addendum)
-  Colchicine once daily x7 days -Limit red meat, seafood, beer -Follow-up with PCP for long-term management  -Please check your blood pressure at home or at the pharmacy. If this continues to be >140/90, follow-up with your primary care provider for further blood pressure management/ medication titration. If you develop chest pain, shortness of breath, vision changes, the worst headache of your life- head straight to the ED or call 911.

## 2021-12-04 NOTE — ED Provider Notes (Signed)
[facility]    CSN: [id number] Arrival date & time: 12/04/21  1332      History   Chief Complaint No chief complaint on file.   HPI Jimmy Hart is a 51 y.o. male presenting with right leg pain intermittently for 1 month.  Long history of gout.  Also with history of hypertension. Declines translation services as he speaks some english; there was some language barrier throughout visit. Was seen at a different urgent care on 1/31, they prescribed prednisone, which improved the symptoms, but then they returned again.  Patient states that he has not had any trauma, falls, and symptoms are consistent with gout.  He denies recent red meat, beer, seafood.  For blood pressure, attributes elevation to pain.  HPI  Past Medical History:  Diagnosis Date   Gout    Hypertension     Patient Active Problem List   Diagnosis Date Noted   Gouty arthritis 01/29/2016   Essential hypertension 05/01/2015    History reviewed. No pertinent surgical history.     Home Medications    Prior to Admission medications   Medication Sig Start Date End Date Taking? Authorizing Provider  colchicine 0.6 MG tablet Take 1 tablet (0.6 mg total) by mouth daily for 7 days. 12/04/21 12/11/21 Yes Rhys Martini, PA-C  lisinopril (PRINIVIL,ZESTRIL) 20 MG tablet Take 1 tablet (20 mg total) by mouth daily. 10/26/14   Carmelina Dane, MD  meloxicam (MOBIC) 7.5 MG tablet Take 1 tablet (7.5 mg total) by mouth daily. 03/29/16   Garnetta Buddy, PA    Family History History reviewed. No pertinent family history.  Social History Social History   Tobacco Use   Smoking status: Every Day    Packs/day: 0.50    Years: 18.00    Pack years: 9.00    Types: Cigarettes  Substance Use Topics   Alcohol use: Yes    Alcohol/week: 0.0 standard drinks   Drug use: No     Allergies   Patient has no known allergies.   Review of Systems Review of Systems  Musculoskeletal:        R ankle pain    All other systems reviewed and are negative.   Physical Exam Triage Vital Signs ED Triage Vitals [12/04/21 1454]  Enc Vitals Group     BP (!) 173/103     Pulse Rate 79     Resp 16     Temp 98.3 F (36.8 C)     Temp Source Oral     SpO2      Weight      Height      Head Circumference      Peak Flow      Pain Score      Pain Loc      Pain Edu?      Excl. in GC?    No data found.  Updated Vital Signs BP (!) 173/103    Pulse 79    Temp 98.3 F (36.8 C) (Oral)    Resp 16   Visual Acuity Right Eye Distance:   Left Eye Distance:   Bilateral Distance:    Right Eye Near:   Left Eye Near:    Bilateral Near:     Physical Exam Vitals reviewed.  Constitutional:      General: He is not in acute distress.    Appearance: Normal appearance. He is not ill-appearing or diaphoretic.  HENT:     Head: Normocephalic and atraumatic.  Cardiovascular:     Rate and Rhythm: Normal rate and regular rhythm.     Heart sounds: Normal heart sounds.  Pulmonary:     Effort: Pulmonary effort is normal.     Breath sounds: Normal breath sounds.  Musculoskeletal:     Comments: R foot - with tenderness and 1+ swelling over the R malleolus and R great MTP joint. Exquisitely tender to palpation. No obvious bony deformity. Ambulating with pain. DP2+, cap refill <2 seconds.   Skin:    General: Skin is warm.  Neurological:     General: No focal deficit present.     Mental Status: He is alert and oriented to person, place, and time.  Psychiatric:        Mood and Affect: Mood normal.        Behavior: Behavior normal.        Thought Content: Thought content normal.        Judgment: Judgment normal.     UC Treatments / Results  Labs (all labs ordered are listed, but only abnormal results are displayed) Labs Reviewed - No data to display  EKG   Radiology No results found.  Procedures Procedures (including critical care time)  Medications Ordered in UC Medications  methylPREDNISolone  sodium succinate (SOLU-MEDROL) 125 mg/2 mL injection 60 mg (has no administration in time range)    Initial Impression / Assessment and Plan / UC Course  I have reviewed the triage vital signs and the nursing notes.  Pertinent labs & imaging results that were available during my care of the patient were reviewed by me and considered in my medical decision making (see chart for details).     This patient is a very pleasant 51 y.o. year old male presenting with acute exacerbation of R foot/ankle gout. Afebrile, nontachy.  Has already failed treatment with low-dose prednisone.  IM solumedrol administered today, and colchicine x1 week sent.  Follow-up with PCP for long-term allopurinol once symptoms resolved.  For hypertension, he attributes the elevation to pain, continue current regimen for now. ED return precautions discussed. Patient verbalizes understanding and agreement. Coding Level 4 for acute exacerbation of chronic condition, and prescription drug management .   Final Clinical Impressions(s) / UC Diagnoses   Final diagnoses:  Gouty arthritis of right ankle  Essential hypertension     Discharge Instructions      -Colchicine once daily x7 days -Limit red meat, seafood, beer -Follow-up with PCP for long-term management  -Please check your blood pressure at home or at the pharmacy. If this continues to be >140/90, follow-up with your primary care provider for further blood pressure management/ medication titration. If you develop chest pain, shortness of breath, vision changes, the worst headache of your life- head straight to the ED or call 911.    ED Prescriptions     Medication Sig Dispense Auth. Provider   colchicine 0.6 MG tablet Take 1 tablet (0.6 mg total) by mouth daily for 7 days. 7 tablet Rhys Martini, PA-C      PDMP not reviewed this encounter.   Rhys Martini, PA-C 12/04/21 1525

## 2021-12-04 NOTE — ED Triage Notes (Signed)
Pt presents to the office for right leg pain x 1 month. Pt reports over the last 4 days pain has increase. No known injury.

## 2021-12-17 ENCOUNTER — Ambulatory Visit (INDEPENDENT_AMBULATORY_CARE_PROVIDER_SITE_OTHER): Payer: 59 | Admitting: Nurse Practitioner

## 2021-12-17 ENCOUNTER — Encounter: Payer: Self-pay | Admitting: Nurse Practitioner

## 2021-12-17 ENCOUNTER — Ambulatory Visit (INDEPENDENT_AMBULATORY_CARE_PROVIDER_SITE_OTHER)
Admission: RE | Admit: 2021-12-17 | Discharge: 2021-12-17 | Disposition: A | Discharge status: Home / Self Care | Payer: 59 | Source: Ambulatory Visit | Attending: Nurse Practitioner | Admitting: Nurse Practitioner

## 2021-12-17 ENCOUNTER — Other Ambulatory Visit: Payer: Self-pay

## 2021-12-17 VITALS — BP 170/108 | HR 85 | Temp 97.5°F | Resp 10 | Ht 64.0 in | Wt 186.1 lb

## 2021-12-17 DIAGNOSIS — M25572 Pain in left ankle and joints of left foot: Secondary | ICD-10-CM

## 2021-12-17 DIAGNOSIS — Z7689 Persons encountering health services in other specified circumstances: Secondary | ICD-10-CM | POA: Diagnosis not present

## 2021-12-17 DIAGNOSIS — E6609 Other obesity due to excess calories: Secondary | ICD-10-CM | POA: Diagnosis not present

## 2021-12-17 DIAGNOSIS — M25571 Pain in right ankle and joints of right foot: Secondary | ICD-10-CM | POA: Insufficient documentation

## 2021-12-17 DIAGNOSIS — I10 Essential (primary) hypertension: Secondary | ICD-10-CM

## 2021-12-17 DIAGNOSIS — M19071 Primary osteoarthritis, right ankle and foot: Secondary | ICD-10-CM | POA: Diagnosis not present

## 2021-12-17 DIAGNOSIS — Z6831 Body mass index (BMI) 31.0-31.9, adult: Secondary | ICD-10-CM

## 2021-12-17 DIAGNOSIS — M19072 Primary osteoarthritis, left ankle and foot: Secondary | ICD-10-CM | POA: Diagnosis not present

## 2021-12-17 DIAGNOSIS — M109 Gout, unspecified: Secondary | ICD-10-CM | POA: Diagnosis not present

## 2021-12-17 DIAGNOSIS — Z0000 Encounter for general adult medical examination without abnormal findings: Secondary | ICD-10-CM | POA: Insufficient documentation

## 2021-12-17 MED ORDER — LISINOPRIL 20 MG PO TABS: 20.0000 mg | ORAL_TABLET | Freq: Every day | ORAL | 0 refills | Status: DC

## 2021-12-17 NOTE — Assessment & Plan Note (Signed)
Bilateral ankle and sometimes foot pain likely related to gout as patient has a strong history of same before.  He has had many many flares if this is the case he will need to be on preventative therapy.  Patient pending labs and bilateral ankle x-rays ?

## 2021-12-17 NOTE — Assessment & Plan Note (Signed)
Uncontrolled hypertension today's office visit.  Been approximate 2 years since patient has had antihypertensive medication.  Discussed importance of this.  Patient does not have the ability to check blood pressure at home.  We will restart lisinopril 20 mg daily.  Follow-up 1 month for blood pressure check and recheck labs ?

## 2021-12-17 NOTE — Assessment & Plan Note (Signed)
Patient's not been seen by primary care provider in approximately 5 years.  Has had 2 recent urgent care visits.  Due to urgent care visits both for gout.  Extended period time since labs been drawn, pending lab results today ?

## 2021-12-17 NOTE — Patient Instructions (Signed)
Nice to see you today ?I will be in touch with the lab and xray results.  ?I sent in your blood pressure medication . You will take 1 tablet once a day ?Follow up with me in 1 month for a recheck, sooner if you need me ?

## 2021-12-17 NOTE — Progress Notes (Signed)
? ?New Patient Office Visit ? ?Subjective:  ?Patient ID: Jimmy Hart, male    DOB: 1971/04/13  Age: 51 y.o. MRN: [id number] ? ?CC:  ?Chief Complaint  ?Patient presents with  ? Establish Care  ? Foot Pain  ?  Right foot was hurting off and on for a long time but the last 2 weeks or so has noticed more pain in both feet now. No numbness or tingling. Has seen urgent care for this and was told gout and was given colchicine for 7 days but it did not help much  ? ? ?HPI ?Jimmy Hart presents for Establish care ?Audry Pili [id number] Interperter services used ? ?HTN: Does not have medication and does not check blood pressure. Last time he had medication was 2 years . Asymptomatic currently   ? ?Foot pain: has been for some time. States that he will swell and that causes the pain to be worse. It is worse when he walks on his feet. States that he was given colchicine and that seemed to help the most. He has not had labs in an extended period of time. He does drink quite a bit of alcohol also. Did discuss this can be a cause of gout. Has had increased pain in bilateral ankles since October. ? ?Past Medical History:  ?Diagnosis Date  ? Gout   ? Hypertension   ? ? ?Past Surgical History:  ?Procedure Laterality Date  ? NO PAST SURGERIES    ? ? ?Family History  ?Problem Relation Age of Onset  ? Diabetes Mother   ? Arthritis Brother   ? Gout Brother   ? ? ?Social History  ? ?Socioeconomic History  ? Marital status: Married  ?  Spouse name: Not on file  ? Number of children: 2  ? Years of education: Not on file  ? Highest education level: Not on file  ?Occupational History  ? Not on file  ?Tobacco Use  ? Smoking status: Every Day  ?  Packs/day: 0.50  ?  Years: 20.00  ?  Pack years: 10.00  ?  Types: Cigarettes  ? Smokeless tobacco: Never  ?Vaping Use  ? Vaping Use: Never used  ?Substance and Sexual Activity  ? Alcohol use: Yes  ?  Comment: 8 beers on a friday usually, but during the week can have 2 to 3 drinks a day.  ? Drug use:  No  ? Sexual activity: Yes  ?Other Topics Concern  ? Not on file  ?Social History Narrative  ? Son 33 and daughter 57  ?   ? Full time: Works on a ranch with animals  ? ?Social Determinants of Health  ? ?Financial Resource Strain: Not on file  ?Food Insecurity: Not on file  ?Transportation Needs: Not on file  ?Physical Activity: Not on file  ?Stress: Not on file  ?Social Connections: Not on file  ?Intimate Partner Violence: Not on file  ? ? ?ROS ?Review of Systems  ?Constitutional:  Negative for chills and fever.  ?Eyes:  Negative for visual disturbance.  ?Respiratory:  Negative for cough and shortness of breath.   ?Cardiovascular:  Positive for leg swelling. Negative for chest pain.  ?Musculoskeletal:  Positive for arthralgias.  ?Neurological:  Negative for headaches.  ? ?Objective:  ? ?Today's Vitals: BP (!) 170/108   Pulse 85   Temp (!) 97.5 ?F (36.4 ?C)   Resp 10   Ht 5\' 4"  (1.626 m)   Wt 186 lb 1 oz (84.4 kg)   SpO2 97%  BMI 31.94 kg/m?  ? ?Physical Exam ?Vitals and nursing note reviewed.  ?Constitutional:   ?   Appearance: He is obese.  ?Cardiovascular:  ?   Rate and Rhythm: Normal rate and regular rhythm.  ?   Pulses:     ?     Dorsalis pedis pulses are 2+ on the right side and 2+ on the left side.  ?     Posterior tibial pulses are 1+ on the right side and 1+ on the left side.  ?   Heart sounds: Normal heart sounds.  ?Pulmonary:  ?   Effort: Pulmonary effort is normal.  ?   Breath sounds: Normal breath sounds.  ?Abdominal:  ?   General: Bowel sounds are normal.  ?Musculoskeletal:  ?   Right knee: Swelling present. Tenderness present.  ?     Legs: ? ?     Feet: ? ?Skin: ?   General: Skin is warm.  ?Neurological:  ?   Mental Status: He is alert.  ?   Gait: Gait abnormal.  ?   Deep Tendon Reflexes:  ?   Reflex Scores: ?     Patellar reflexes are 2+ on the right side and 2+ on the left side. ?   Comments: Bilateral upper and lower extremity strength 5/5  ? ? ?Assessment & Plan:  ? ?Problem List Items  Addressed This Visit   ? ?  ? Cardiovascular and Mediastinum  ? Essential hypertension  ?  Uncontrolled hypertension today's office visit.  Been approximate 2 years since patient has had antihypertensive medication.  Discussed importance of this.  Patient does not have the ability to check blood pressure at home.  We will restart lisinopril 20 mg daily.  Follow-up 1 month for blood pressure check and recheck labs ?  ?  ? Relevant Medications  ? lisinopril (ZESTRIL) 20 MG tablet  ? Other Relevant Orders  ? CBC with Differential/Platelet  ? Comprehensive metabolic panel  ? TSH  ? Lipid panel  ?  ? Musculoskeletal and Integument  ? Gouty arthritis  ?  Cervical diagnosis.  Is been treated for many times in the past.  Most recently treated with colchicine for 7 days that was beneficial.  Pending lab results before we decide treatment option.  States injection of steroid was not beneficial ?  ?  ? Relevant Orders  ? Uric acid  ?  ? Other  ? Class 1 obesity due to excess calories with serious comorbidity and body mass index (BMI) of 31.0 to 31.9 in adult  ? Relevant Orders  ? Hemoglobin A1c  ? TSH  ? Lipid panel  ? Establishing care with new doctor, encounter for - Primary  ?  Patient's not been seen by primary care provider in approximately 5 years.  Has had 2 recent urgent care visits.  Due to urgent care visits both for gout.  Extended period time since labs been drawn, pending lab results today ?  ?  ? Relevant Orders  ? CBC with Differential/Platelet  ? Comprehensive metabolic panel  ? Acute bilateral ankle pain  ?  Bilateral ankle and sometimes foot pain likely related to gout as patient has a strong history of same before.  He has had many many flares if this is the case he will need to be on preventative therapy.  Patient pending labs and bilateral ankle x-rays ?  ?  ? Relevant Orders  ? DG Ankle Complete Left  ? DG Ankle Complete Right  ?  Uric acid  ? ? ?Outpatient Encounter Medications as of 12/17/2021  ?Medication  Sig  ? lisinopril (ZESTRIL) 20 MG tablet Take 1 tablet (20 mg total) by mouth daily.  ? meloxicam (MOBIC) 7.5 MG tablet Take 1 tablet (7.5 mg total) by mouth daily. (Patient not taking: Reported on 12/17/2021)  ? [DISCONTINUED] colchicine 0.6 MG tablet Take 1 tablet (0.6 mg total) by mouth daily for 7 days.  ? [DISCONTINUED] lisinopril (PRINIVIL,ZESTRIL) 20 MG tablet Take 1 tablet (20 mg total) by mouth daily. (Patient not taking: Reported on 12/17/2021)  ? ?No facility-administered encounter medications on file as of 12/17/2021.  ? ? ?Follow-up: Return in about 4 weeks (around 01/14/2022) for BP recheck.  ? ?This visit occurred during the SARS-CoV-2 public health emergency.  Safety protocols were in place, including screening questions prior to the visit, additional usage of staff PPE, and extensive cleaning of exam room while observing appropriate contact time as indicated for disinfecting solutions.  ? ?Audria Nine, NP ? ?

## 2021-12-17 NOTE — Assessment & Plan Note (Signed)
Cervical diagnosis.  Is been treated for many times in the past.  Most recently treated with colchicine for 7 days that was beneficial.  Pending lab results before we decide treatment option.  States injection of steroid was not beneficial ?

## 2021-12-18 LAB — CBC WITH DIFFERENTIAL/PLATELET
Basophils Absolute: 0.1 10*3/uL (ref 0.0–0.1)
Basophils Relative: 0.6 % (ref 0.0–3.0)
Eosinophils Absolute: 0.3 10*3/uL (ref 0.0–0.7)
Eosinophils Relative: 2.2 % (ref 0.0–5.0)
HCT: 44.3 % (ref 39.0–52.0)
Hemoglobin: 15.5 g/dL (ref 13.0–17.0)
Lymphocytes Relative: 25.1 % (ref 12.0–46.0)
Lymphs Abs: 2.9 10*3/uL (ref 0.7–4.0)
MCHC: 34.9 g/dL (ref 30.0–36.0)
MCV: 93.2 fl (ref 78.0–100.0)
Monocytes Absolute: 0.8 10*3/uL (ref 0.1–1.0)
Monocytes Relative: 6.9 % (ref 3.0–12.0)
Neutro Abs: 7.6 10*3/uL (ref 1.4–7.7)
Neutrophils Relative %: 65.2 % (ref 43.0–77.0)
Platelets: 248 10*3/uL (ref 150.0–400.0)
RBC: 4.76 Mil/uL (ref 4.22–5.81)
RDW: 12.8 % (ref 11.5–15.5)
WBC: 11.7 10*3/uL — ABNORMAL HIGH (ref 4.0–10.5)

## 2021-12-18 LAB — COMPREHENSIVE METABOLIC PANEL
ALT: 38 U/L (ref 0–53)
AST: 26 U/L (ref 0–37)
Albumin: 4.6 g/dL (ref 3.5–5.2)
Alkaline Phosphatase: 69 U/L (ref 39–117)
BUN: 12 mg/dL (ref 6–23)
CO2: 26 mEq/L (ref 19–32)
Calcium: 9.4 mg/dL (ref 8.4–10.5)
Chloride: 103 mEq/L (ref 96–112)
Creatinine, Ser: 0.93 mg/dL (ref 0.40–1.50)
GFR: 97.02 mL/min (ref >60.00)
Glucose, Bld: 86 mg/dL (ref 70–99)
Potassium: 3.9 mEq/L (ref 3.5–5.1)
Sodium: 139 mEq/L (ref 135–145)
Total Bilirubin: 0.7 mg/dL (ref 0.2–1.2)
Total Protein: 7.4 g/dL (ref 6.0–8.3)

## 2021-12-18 LAB — TSH: TSH: 1.08 u[IU]/mL (ref 0.35–5.50)

## 2021-12-18 LAB — LIPID PANEL
Cholesterol: 259 mg/dL — ABNORMAL HIGH (ref 0–200)
HDL: 46.6 mg/dL (ref >39.00)
NonHDL: 212.8
Total CHOL/HDL Ratio: 6
Triglycerides: 397 mg/dL — ABNORMAL HIGH (ref 0.0–149.0)
VLDL: 79.4 mg/dL — ABNORMAL HIGH (ref 0.0–40.0)

## 2021-12-18 LAB — LDL CHOLESTEROL, DIRECT: Direct LDL: 172 mg/dL

## 2021-12-18 LAB — HEMOGLOBIN A1C: Hgb A1c MFr Bld: 6.1 % (ref 4.6–6.5)

## 2021-12-18 LAB — URIC ACID: Uric Acid, Serum: 10.1 mg/dL — ABNORMAL HIGH (ref 4.0–7.8)

## 2021-12-21 ENCOUNTER — Other Ambulatory Visit: Payer: Self-pay | Admitting: Family

## 2021-12-21 DIAGNOSIS — M109 Gout, unspecified: Secondary | ICD-10-CM

## 2021-12-21 MED ORDER — METHYLPREDNISOLONE 4 MG PO TBPK: ORAL_TABLET | ORAL | 0 refills | Status: DC

## 2021-12-22 ENCOUNTER — Other Ambulatory Visit: Payer: Self-pay | Admitting: Nurse Practitioner

## 2021-12-22 ENCOUNTER — Telehealth: Payer: Self-pay | Admitting: Nurse Practitioner

## 2021-12-22 DIAGNOSIS — M109 Gout, unspecified: Secondary | ICD-10-CM

## 2021-12-22 MED ORDER — ROSUVASTATIN CALCIUM 5 MG PO TABS: 5.0000 mg | ORAL_TABLET | Freq: Every day | ORAL | 1 refills | Status: DC

## 2021-12-22 MED ORDER — ALLOPURINOL 100 MG PO TABS: 100.0000 mg | ORAL_TABLET | Freq: Every day | ORAL | 6 refills | Status: DC

## 2021-12-22 NOTE — Telephone Encounter (Signed)
Allopurinol is the gout medicine that he needs to wait and start taking ? ?Crestor (rosuvstatin) is the cholesterol medication that he can go ahead and start taking.  ?

## 2021-12-23 NOTE — Telephone Encounter (Signed)
Left message for patient to call back with interpreter ?

## 2021-12-25 NOTE — Telephone Encounter (Signed)
Called patient with interpreter and advised of below. Made sure patient understood instructions. Patient has follow up 01/18/22 ?

## 2022-01-05 ENCOUNTER — Telehealth: Payer: Self-pay

## 2022-01-05 ENCOUNTER — Other Ambulatory Visit: Payer: Self-pay | Admitting: Nurse Practitioner

## 2022-01-05 DIAGNOSIS — M109 Gout, unspecified: Secondary | ICD-10-CM

## 2022-01-05 MED ORDER — ALLOPURINOL 100 MG PO TABS: 200.0000 mg | ORAL_TABLET | Freq: Every day | ORAL | 0 refills | Status: DC

## 2022-01-05 NOTE — Telephone Encounter (Signed)
Patient advised and verbalized understanding. He did ask for a new RX to be sent to the pharmacy to have before running out ?

## 2022-01-05 NOTE — Telephone Encounter (Signed)
Patient came by the office stating that his foot has pain off and on still. But now he also has swelling pain in his right hand, had visual swelling over his knuckles. No redness noted. Pain is present. This started yesterday. NO injury. Patient states its gout. While on Prednisone his foot pain got better some but never resolved. Patient has been taking Allopurinol 100 mg daily as directed. Patient wonders if he should take a higher dose. ?

## 2022-01-05 NOTE — Telephone Encounter (Signed)
If it has been 2 weeks he can up the allopurinol to 200mg  daily. So he can take 2 of the 100mg  tablets. Let me know if he needs a new script for this. I imagine he will  ?

## 2022-01-05 NOTE — Telephone Encounter (Signed)
Script sent in . Can we call the pharmacy and cancel the refills and the original allopurinol 100mg  please ?

## 2022-01-06 NOTE — Telephone Encounter (Signed)
Called pharmacy on 01/05/22 and cancelled RX for Allopurinol 100 mg to take 1 tablet daily ?

## 2022-01-18 ENCOUNTER — Encounter: Payer: Self-pay | Admitting: Nurse Practitioner

## 2022-01-18 ENCOUNTER — Ambulatory Visit (INDEPENDENT_AMBULATORY_CARE_PROVIDER_SITE_OTHER): Payer: 59 | Admitting: Nurse Practitioner

## 2022-01-18 VITALS — BP 126/84 | HR 96 | Wt 185.0 lb

## 2022-01-18 DIAGNOSIS — M109 Gout, unspecified: Secondary | ICD-10-CM

## 2022-01-18 DIAGNOSIS — I10 Essential (primary) hypertension: Secondary | ICD-10-CM | POA: Diagnosis not present

## 2022-01-18 DIAGNOSIS — M79672 Pain in left foot: Secondary | ICD-10-CM | POA: Diagnosis not present

## 2022-01-18 DIAGNOSIS — M79671 Pain in right foot: Secondary | ICD-10-CM | POA: Diagnosis not present

## 2022-01-18 NOTE — Progress Notes (Signed)
? ?Established Patient Office Visit ? ?Subjective:  ?Patient ID: Jimmy SatLeopoldo Hart, male    DOB: 1971/05/12  Age: 51 y.o. MRN: [id number] ? ?CC:  ?Chief Complaint  ?Patient presents with  ? Gout  ?  Gout in foot , flare ups   started  4 days ago, on going since oct., taking medication , does think it gout   ? ? ?HPI ?Jimmy Hart presents for gout ? ?Last time paitnet was treated for gout with a steroid pack and then placed on allupurional 100mg . He can back recently and we upped the dose to 200mg  daily. He is requesting an increased dose ? ?States that the gout has been on and off. Thinks that he thinks feet are hurting for the varicose veins. States the pain feels different. States that both legs hurt daily and he thinks it is due to the veins. He describes it as ?If he is sitting or laying down. States it hurts with walking and standing. Like an electric pain  ?When he walks on gravel rock it hurts bad ? ?HTN: started patient back on lisinopril for blood pressure. He will check his blood pressure at home. It has been good.  Checks it approx 2-3 times a month ? ?ETOH: states that he has reduced  alcohol and red meat intake. States that he is drinking 3 times a month at 5 beers each time ? ?Johnna AcostaFernanda # [id number] was the interpreter used during the visit ? ?Past Medical History:  ?Diagnosis Date  ? Gout   ? Hypertension   ? ? ?Past Surgical History:  ?Procedure Laterality Date  ? NO PAST SURGERIES    ? ? ?Family History  ?Problem Relation Age of Onset  ? Diabetes Mother   ? Arthritis Brother   ? Gout Brother   ? ? ?Social History  ? ?Socioeconomic History  ? Marital status: Married  ?  Spouse name: Not on file  ? Number of children: 2  ? Years of education: Not on file  ? Highest education level: Not on file  ?Occupational History  ? Not on file  ?Tobacco Use  ? Smoking status: Every Day  ?  Packs/day: 0.50  ?  Years: 20.00  ?  Pack years: 10.00  ?  Types: Cigarettes  ? Smokeless tobacco: Never  ?Vaping Use  ?  Vaping Use: Never used  ?Substance and Sexual Activity  ? Alcohol use: Yes  ?  Comment: 8 beers on a friday usually, but during the week can have 2 to 3 drinks a day.  ? Drug use: No  ? Sexual activity: Yes  ?Other Topics Concern  ? Not on file  ?Social History Narrative  ? Son 2024 and daughter 3320  ?   ? Full time: Works on a ranch with animals  ? ?Social Determinants of Health  ? ?Financial Resource Strain: Not on file  ?Food Insecurity: Not on file  ?Transportation Needs: Not on file  ?Physical Activity: Not on file  ?Stress: Not on file  ?Social Connections: Not on file  ?Intimate Partner Violence: Not on file  ? ? ?Outpatient Medications Prior to Visit  ?Medication Sig Dispense Refill  ? allopurinol (ZYLOPRIM) 100 MG tablet Take 2 tablets (200 mg total) by mouth daily. 60 tablet 0  ? lisinopril (ZESTRIL) 20 MG tablet Take 1 tablet (20 mg total) by mouth daily. 90 tablet 0  ? meloxicam (MOBIC) 7.5 MG tablet Take 1 tablet (7.5 mg total) by mouth daily. 30 tablet 0  ?  rosuvastatin (CRESTOR) 5 MG tablet Take 1 tablet (5 mg total) by mouth daily. 90 tablet 1  ? methylPREDNISolone (MEDROL DOSEPAK) 4 MG TBPK tablet Take per package instructions 21 tablet 0  ? ?No facility-administered medications prior to visit.  ? ? ?No Known Allergies ? ?ROS ?Review of Systems  ?Constitutional:  Negative for chills and fever.  ?Respiratory:  Negative for cough and shortness of breath.   ?Cardiovascular:  Negative for chest pain and leg swelling.  ?Musculoskeletal:  Positive for arthralgias and joint swelling.  ?Neurological:  Negative for dizziness, light-headedness and headaches.  ? ?  ?Objective:  ?  ?Physical Exam ?Vitals and nursing note reviewed.  ?Constitutional:   ?   Appearance: Normal appearance.  ?Cardiovascular:  ?   Rate and Rhythm: Normal rate and regular rhythm.  ?   Pulses: Normal pulses.  ?   Heart sounds: Normal heart sounds.  ?Pulmonary:  ?   Effort: Pulmonary effort is normal.  ?   Breath sounds: Normal breath  sounds.  ?Abdominal:  ?   General: Bowel sounds are normal.  ?Musculoskeletal:  ?   Right lower leg: No edema.  ?   Left lower leg: No edema.  ?Feet:  ?   Comments: Bilateral feet within normal limits.  Patient does describe bilateral feet pain worse with standing and walking but okay with rest even in the dependent state. ?Skin: ?   General: Skin is warm.  ?Neurological:  ?   General: No focal deficit present.  ?   Mental Status: He is alert.  ? ? ?BP 126/84   Pulse 96   Wt 185 lb (83.9 kg)   SpO2 97%   BMI 31.76 kg/m?  ?Wt Readings from Last 3 Encounters:  ?01/18/22 185 lb (83.9 kg)  ?12/17/21 186 lb 1 oz (84.4 kg)  ?03/29/16 176 lb (79.8 kg)  ? ? ? ?Health Maintenance Due  ?Topic Date Due  ? HIV Screening  Never done  ? Hepatitis C Screening  Never done  ? COLONOSCOPY (Pts 45-62yrs Insurance coverage will need to be confirmed)  Never done  ? COVID-19 Vaccine (3 - Booster for Moderna series) 10/06/2020  ? ? ?There are no preventive care reminders to display for this patient. ? ?Lab Results  ?Component Value Date  ? TSH 1.08 12/17/2021  ? ?Lab Results  ?Component Value Date  ? WBC 11.7 (H) 12/17/2021  ? HGB 15.5 12/17/2021  ? HCT 44.3 12/17/2021  ? MCV 93.2 12/17/2021  ? PLT 248.0 12/17/2021  ? ?Lab Results  ?Component Value Date  ? NA 139 12/17/2021  ? K 3.9 12/17/2021  ? CO2 26 12/17/2021  ? GLUCOSE 86 12/17/2021  ? BUN 12 12/17/2021  ? CREATININE 0.93 12/17/2021  ? BILITOT 0.7 12/17/2021  ? ALKPHOS 69 12/17/2021  ? AST 26 12/17/2021  ? ALT 38 12/17/2021  ? PROT 7.4 12/17/2021  ? ALBUMIN 4.6 12/17/2021  ? CALCIUM 9.4 12/17/2021  ? GFR 97.02 12/17/2021  ? ?Lab Results  ?Component Value Date  ? CHOL 259 (H) 12/17/2021  ? ?Lab Results  ?Component Value Date  ? HDL 46.60 12/17/2021  ? ?No results found for: LDLCALC ?Lab Results  ?Component Value Date  ? TRIG 397.0 (H) 12/17/2021  ? ?Lab Results  ?Component Value Date  ? CHOLHDL 6 12/17/2021  ? ?Lab Results  ?Component Value Date  ? HGBA1C 6.1 12/17/2021  ? ? ?   ?Assessment & Plan:  ? ?Problem List Items Addressed This Visit   ? ?  ?  Cardiovascular and Mediastinum  ? Essential hypertension - Primary  ?  Patient currently maintained on Toprol 20 mg daily.  Patient states he is tolerating the medication well.  Blood pressure within normal limits in office today.  Pending blood work to make sure were not hyperkalemic or treated renal injury. ?  ?  ? Relevant Orders  ? Comprehensive metabolic panel  ?  ? Musculoskeletal and Integument  ? Gouty arthritis  ?  Easily treated with a prednisone pack for gout.  Feels like the pain is resolved we also placed him on allopurinol 100 mg in which we titrated up to 200 mg.  Recheck CMP and uric acid today pending results continue medication ?  ?  ? Relevant Orders  ? Comprehensive metabolic panel  ? Uric acid  ?  ? Other  ? Bilateral foot pain  ?  She describes bilateral ankle and feet pain.  Assuming we have gout under well control with medications and preventative medications we will refer patient to podiatry for further evaluation.  Patient thinks is related to varicose veins he does have some spider veins but no overly large ropey varicose veins appreciated on exam today.  Patient is having discomfort when he is on his feet or walking.  No discomfort with rest or even legs in dependent position. ?  ?  ? Relevant Orders  ? Ambulatory referral to Podiatry  ? ? ?No orders of the defined types were placed in this encounter. ? ? ?Follow-up: Return in about 6 months (around 07/20/2022) for CPE with labs.  ? ? ?Audria Nine, NP ?

## 2022-01-18 NOTE — Assessment & Plan Note (Signed)
Easily treated with a prednisone pack for gout.  Feels like the pain is resolved we also placed him on allopurinol 100 mg in which we titrated up to 200 mg.  Recheck CMP and uric acid today pending results continue medication ?

## 2022-01-18 NOTE — Assessment & Plan Note (Signed)
Patient currently maintained on Toprol 20 mg daily.  Patient states he is tolerating the medication well.  Blood pressure within normal limits in office today.  Pending blood work to make sure were not hyperkalemic or treated renal injury. ?

## 2022-01-18 NOTE — Assessment & Plan Note (Signed)
She describes bilateral ankle and feet pain.  Assuming we have gout under well control with medications and preventative medications we will refer patient to podiatry for further evaluation.  Patient thinks is related to varicose veins he does have some spider veins but no overly large ropey varicose veins appreciated on exam today.  Patient is having discomfort when he is on his feet or walking.  No discomfort with rest or even legs in dependent position. ?

## 2022-01-19 LAB — COMPREHENSIVE METABOLIC PANEL
ALT: 27 U/L (ref 0–53)
AST: 19 U/L (ref 0–37)
Albumin: 4.5 g/dL (ref 3.5–5.2)
Alkaline Phosphatase: 69 U/L (ref 39–117)
BUN: 15 mg/dL (ref 6–23)
CO2: 28 mEq/L (ref 19–32)
Calcium: 9.6 mg/dL (ref 8.4–10.5)
Chloride: 104 mEq/L (ref 96–112)
Creatinine, Ser: 1.27 mg/dL (ref 0.40–1.50)
GFR: 66.71 mL/min (ref >60.00)
Glucose, Bld: 81 mg/dL (ref 70–99)
Potassium: 3.8 mEq/L (ref 3.5–5.1)
Sodium: 142 mEq/L (ref 135–145)
Total Bilirubin: 0.5 mg/dL (ref 0.2–1.2)
Total Protein: 7.3 g/dL (ref 6.0–8.3)

## 2022-01-19 LAB — URIC ACID: Uric Acid, Serum: 5.8 mg/dL (ref 4.0–7.8)

## 2022-01-20 ENCOUNTER — Other Ambulatory Visit: Payer: Self-pay | Admitting: Nurse Practitioner

## 2022-01-20 DIAGNOSIS — M109 Gout, unspecified: Secondary | ICD-10-CM

## 2022-01-20 DIAGNOSIS — I10 Essential (primary) hypertension: Secondary | ICD-10-CM

## 2022-01-20 MED ORDER — LISINOPRIL 20 MG PO TABS: 20.0000 mg | ORAL_TABLET | Freq: Every day | ORAL | 1 refills | Status: DC

## 2022-01-20 MED ORDER — ALLOPURINOL 100 MG PO TABS: 200.0000 mg | ORAL_TABLET | Freq: Every day | ORAL | 6 refills | Status: DC

## 2022-01-27 ENCOUNTER — Ambulatory Visit: Payer: 59 | Admitting: Podiatry

## 2022-01-27 ENCOUNTER — Encounter: Payer: Self-pay | Admitting: Podiatry

## 2022-01-27 ENCOUNTER — Other Ambulatory Visit: Payer: Self-pay | Admitting: Podiatry

## 2022-01-27 ENCOUNTER — Ambulatory Visit (INDEPENDENT_AMBULATORY_CARE_PROVIDER_SITE_OTHER): Payer: 59

## 2022-01-27 DIAGNOSIS — M109 Gout, unspecified: Secondary | ICD-10-CM

## 2022-01-27 DIAGNOSIS — M778 Other enthesopathies, not elsewhere classified: Secondary | ICD-10-CM

## 2022-01-27 MED ORDER — METHYLPREDNISOLONE 4 MG PO TBPK: ORAL_TABLET | ORAL | 0 refills | Status: DC

## 2022-01-28 DIAGNOSIS — M109 Gout, unspecified: Secondary | ICD-10-CM | POA: Diagnosis not present

## 2022-01-29 LAB — URIC ACID: Uric Acid: 6.6 mg/dL (ref 3.8–8.4)

## 2022-01-31 ENCOUNTER — Encounter: Payer: Self-pay | Admitting: Podiatry

## 2022-01-31 NOTE — Progress Notes (Signed)
?  Subjective:  ?Patient ID: Jimmy Hart, male    DOB: 1971/02/22,  MRN: [id number] ? ?He has had bilateral foot and ankle pain and swelling for about 6 to 7 months.  Was off and on in the past month.  Has been worse with walking and is painful all over. ? ?51 y.o. male presents with the above complaint. History confirmed with patient.  ? ?Objective:  ?Physical Exam: ?warm, good capillary refill, no trophic changes or ulcerative lesions, normal DP and PT pulses, normal sensory exam, and diffuse nonspecific pain over the dorsal midfoot bilateral, minor edema and midfoot ? ? 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 ?1. Gout of both feet   ? ? ? ?Plan:  ?Patient was evaluated and treated and all questions answered. ? ?Discussed with him that I do think this is persistent gouty arthropathy.  His uric acid recently was in the normal range but I think the aftereffects of the gout is what he is experiencing now.  I recommended a methylprednisolone taper for anti-inflammatory.  Discussed with him to discuss further with his PCP I will reevaluate him in 1 month ? ?Return in about 1 month (around 02/26/2022) for after lab work to review, follow up on gout .  ? ?

## 2022-02-08 ENCOUNTER — Telehealth: Payer: Self-pay | Admitting: Nurse Practitioner

## 2022-02-08 DIAGNOSIS — M109 Gout, unspecified: Secondary | ICD-10-CM

## 2022-02-08 NOTE — Telephone Encounter (Signed)
Pt needs meds refilled - allopurinol 100 MG  ? ?[facility]  ?[place], [place] [address] ?[phone] // [phone] ?

## 2022-02-08 NOTE — Telephone Encounter (Signed)
Spoke with patient, advised refills were on file with the pharmacy. Patient wanted to know if he can increase his dose. Gout pain is not resolved and not helping as good as it was. Would like to try a stronger dose or any other suggestions ?

## 2022-02-09 NOTE — Telephone Encounter (Signed)
He saw podiatry and they think it was the after effects of gout and recommended a steroid pack and that script was written. Has he taken it and did it help? ?

## 2022-02-10 MED ORDER — COLCHICINE 0.6 MG PO TABS: ORAL_TABLET | ORAL | 0 refills | Status: DC

## 2022-02-10 MED ORDER — ALLOPURINOL 300 MG PO TABS: 300.0000 mg | ORAL_TABLET | Freq: Every day | ORAL | 6 refills | Status: DC

## 2022-02-10 NOTE — Telephone Encounter (Signed)
We can up the allopurinol to 300mg  a day. I will send in an update prescription. He can take 3 tablets of the 100mg  until he is out.  ?Colchicine is not used as a preventative but can be used during a gout flare. He will need to have the lab rechecked in 2 weeks after starting the increased dose of the medication. ? ?I will send in a course of colchicine to calm the gout down currently . If he starts having bad muscle aches or really dark tea colored urine he needs to stop the colchicine  ?

## 2022-02-10 NOTE — Addendum Note (Signed)
Addended by: Michela Pitcher on: 02/10/2022 02:01 PM ? ? Modules accepted: Orders ? ?

## 2022-02-10 NOTE — Telephone Encounter (Signed)
Called patient with interpreter, patient states he did pick up steroid medication and took it for a week. While on the medication his symptoms were much better but once he stops symptoms come back. Allopurinol dose of 100 mg 2 tablets daily is not helping and patient states he use to take another medication (per chart was Colchicine) and that helped much better. He would like to get helped with getting relief. Please advise ?

## 2022-02-11 NOTE — Telephone Encounter (Signed)
Patient advised.

## 2022-02-11 NOTE — Telephone Encounter (Signed)
Called interpreter and called the patient. Explained everything in details. Lab appointment made for 03/01/22. ?Patient states that he is scheduled to follow up with Podiatrist on 03/01/22 who has told him its gout and since he is been managed by [place] at this time and nothing else is bothering him can he cancel that at this time. Its $100 for the patient to go and see that person for the same thing. Patient was advised I would check with Catalina [place] to see what he suggest since its also the financial cost. ?

## 2022-02-11 NOTE — Telephone Encounter (Signed)
That is fine I will continue to manage. If it is too much money that is understandable ?

## 2022-02-27 ENCOUNTER — Other Ambulatory Visit: Payer: Self-pay | Admitting: Nurse Practitioner

## 2022-02-27 DIAGNOSIS — M109 Gout, unspecified: Secondary | ICD-10-CM

## 2022-03-01 ENCOUNTER — Ambulatory Visit: Payer: 59 | Admitting: Podiatry

## 2022-03-01 ENCOUNTER — Other Ambulatory Visit (INDEPENDENT_AMBULATORY_CARE_PROVIDER_SITE_OTHER): Payer: 59

## 2022-03-01 ENCOUNTER — Telehealth: Payer: Self-pay | Admitting: *Deleted

## 2022-03-01 DIAGNOSIS — M109 Gout, unspecified: Secondary | ICD-10-CM

## 2022-03-01 NOTE — Telephone Encounter (Signed)
Pt came in for labs and had a medication question. Pt said he is still dealing with gout and wanted to know if his med should be changed or increased. Pt said he has been taking the Allopurinol but still having gout. Pt said when he was on the colchicine for 5 days his gout got a lot better but as soon as he stopped that med and went back to allopurinol it returned. Pt asked if he can be on the colchicine every day or if not can he increase his allopurinol. Pt not sure what to do, I advise pt I would send message to PCP and his assistant will f/u with him ?

## 2022-03-02 LAB — BASIC METABOLIC PANEL
BUN: 14 mg/dL (ref 6–23)
CO2: 25 mEq/L (ref 19–32)
Calcium: 9.5 mg/dL (ref 8.4–10.5)
Chloride: 105 mEq/L (ref 96–112)
Creatinine, Ser: 0.95 mg/dL (ref 0.40–1.50)
GFR: 94.44 mL/min (ref >60.00)
Glucose, Bld: 102 mg/dL — ABNORMAL HIGH (ref 70–99)
Potassium: 3.7 mEq/L (ref 3.5–5.1)
Sodium: 143 mEq/L (ref 135–145)

## 2022-03-02 LAB — URIC ACID: Uric Acid, Serum: 4.6 mg/dL (ref 4.0–7.8)

## 2022-03-02 MED ORDER — COLCHICINE 0.6 MG PO TABS: 0.6000 mg | ORAL_TABLET | Freq: Every day | ORAL | 0 refills | Status: DC

## 2022-03-02 NOTE — Telephone Encounter (Signed)
I will send in a months worth of the colchicine to see if that will get the gout under control. He can continue the allopurinol. Be on the look out for increased muscle pain/fatigue. Tea colored or dark colored urine. If those happen he needs to stop the colchicine and get labs checked ?

## 2022-03-02 NOTE — Telephone Encounter (Signed)
Patient advised.

## 2022-03-24 ENCOUNTER — Other Ambulatory Visit: Payer: Self-pay | Admitting: Nurse Practitioner

## 2022-03-24 DIAGNOSIS — M109 Gout, unspecified: Secondary | ICD-10-CM

## 2022-05-22 ENCOUNTER — Other Ambulatory Visit: Payer: Self-pay | Admitting: Nurse Practitioner

## 2022-07-22 ENCOUNTER — Other Ambulatory Visit: Payer: Self-pay | Admitting: Nurse Practitioner

## 2022-07-22 DIAGNOSIS — I10 Essential (primary) hypertension: Secondary | ICD-10-CM

## 2022-07-26 ENCOUNTER — Ambulatory Visit (INDEPENDENT_AMBULATORY_CARE_PROVIDER_SITE_OTHER): Payer: 59 | Admitting: Nurse Practitioner

## 2022-07-26 ENCOUNTER — Encounter: Payer: Self-pay | Admitting: Nurse Practitioner

## 2022-07-26 VITALS — BP 162/102 | HR 80 | Temp 99.0°F | Ht 64.0 in | Wt 184.0 lb

## 2022-07-26 DIAGNOSIS — I10 Essential (primary) hypertension: Secondary | ICD-10-CM

## 2022-07-26 DIAGNOSIS — M109 Gout, unspecified: Secondary | ICD-10-CM

## 2022-07-26 DIAGNOSIS — Z0000 Encounter for general adult medical examination without abnormal findings: Secondary | ICD-10-CM

## 2022-07-26 DIAGNOSIS — Z6831 Body mass index (BMI) 31.0-31.9, adult: Secondary | ICD-10-CM

## 2022-07-26 DIAGNOSIS — E6609 Other obesity due to excess calories: Secondary | ICD-10-CM

## 2022-07-26 DIAGNOSIS — Z1211 Encounter for screening for malignant neoplasm of colon: Secondary | ICD-10-CM | POA: Diagnosis not present

## 2022-07-26 DIAGNOSIS — R7303 Prediabetes: Secondary | ICD-10-CM | POA: Insufficient documentation

## 2022-07-26 DIAGNOSIS — Z720 Tobacco use: Secondary | ICD-10-CM

## 2022-07-26 MED ORDER — LISINOPRIL 40 MG PO TABS: 40.0000 mg | ORAL_TABLET | Freq: Every day | ORAL | 3 refills | Status: DC

## 2022-07-26 NOTE — Assessment & Plan Note (Signed)
Currently maintained on allopurinol.  Check CMP and uric acid pending

## 2022-07-26 NOTE — Assessment & Plan Note (Signed)
Patient has lost a small amount of weight since previous office visits.  Pending A1c today

## 2022-07-26 NOTE — Progress Notes (Signed)
Established Patient Office Visit  Subjective   Patient ID: Jimmy Hart, male    DOB: 09-25-1971  Age: 51 y.o. MRN: [id number]  Chief Complaint  Patient presents with   Annual Exam    Not fasting     HPI  Rip Harbour: [id number] [place] interpreter     HTN: States that he is taking the blood pressure medication as prescribed. States he checks his blood pressure approx 2 a month. Does not know the numbers.   for complete physical and follow up of chronic conditions.  Immunizations: -Tetanus:2016 -Influenza: refused. -Shingles: too young  -Pneumonia: too young Coivd vaccines: First two no booster -HPV: aged out  Diet: [place]. 3 meals a day. Does snack sometimes through out the day. Does drink coffee water or sodas.   Exercise: No regular exercise. Walks a lot in the mornings with work.  Eye exam: Needs updating. No regular check up Dental exam: Needs updating has been seen in [place] around 2 years  Colonoscopy: Completed in never refer to [place] Screening: Too young Dexa: N/A  PSA: Too young, currently average risk  Sleep: states that he goes to sleep around 930-10 and gets up around 7am. Sometimes he does not feel rested. Has been told that he snores.    Gout: Has been on allopurinol and colchicine in the past. He thanks that it is improving. States that it is not Impacting him as much as before.  Currently just maintained on allopurinol and tolerating it well per patient report   Review of Systems  Constitutional:  Negative for chills and fever.  Respiratory:  Negative for shortness of breath.   Cardiovascular:  Negative for chest pain.  Gastrointestinal:  Negative for abdominal pain, blood in stool, constipation, diarrhea, heartburn, nausea and vomiting.       BM daily  Genitourinary:  Negative for dysuria and hematuria.  Neurological:  Negative for headaches.  Psychiatric/Behavioral:  Negative for hallucinations and suicidal ideas.        Objective:     BP (!) 162/102   Pulse 80   Temp 99 F (37.2 C) (Temporal)   Ht 5\' 4"  (1.626 m)   Wt 184 lb (83.5 kg)   SpO2 98%   BMI 31.58 kg/m  BP Readings from Last 3 Encounters:  07/26/22 (!) 162/102  01/18/22 126/84  12/17/21 (!) 170/108   Wt Readings from Last 3 Encounters:  07/26/22 184 lb (83.5 kg)  01/18/22 185 lb (83.9 kg)  12/17/21 186 lb 1 oz (84.4 kg)      Physical Exam Vitals and nursing note reviewed. Exam conducted with a chaperone present Claiborne Billings [place], CMA).  Constitutional:      Appearance: Normal appearance.  HENT:     Right Ear: Tympanic membrane, ear canal and external ear normal.     Left Ear: Tympanic membrane, ear canal and external ear normal.     Mouth/Throat:     Mouth: Mucous membranes are moist.     Pharynx: Oropharynx is clear.  Eyes:     Extraocular Movements: Extraocular movements intact.     Pupils: Pupils are equal, round, and reactive to light.  Cardiovascular:     Rate and Rhythm: Normal rate and regular rhythm.     Heart sounds: Normal heart sounds.  Pulmonary:     Effort: Pulmonary effort is normal.     Breath sounds: Normal breath sounds.  Abdominal:     General: Bowel sounds are normal. There is no distension.  Palpations: There is no mass.     Tenderness: There is no abdominal tenderness.     Hernia: No hernia is present. There is no hernia in the left inguinal area or right inguinal area.  Genitourinary:    Penis: Normal.      Testes: Normal.     Epididymis:     Right: Normal.     Left: Normal.  Musculoskeletal:     Right lower leg: No edema.     Left lower leg: No edema.  Lymphadenopathy:     Cervical: No cervical adenopathy.     Lower Body: No right inguinal adenopathy. No left inguinal adenopathy.  Neurological:     General: No focal deficit present.     Mental Status: He is alert.     Deep Tendon Reflexes:     Reflex Scores:      Bicep reflexes are 2+ on the right side and 2+ on the left side.       Patellar reflexes are 2+ on the right side and 2+ on the left side.    Comments: Bilateral upper and lower extremity strength 5/5.  Psychiatric:        Mood and Affect: Mood normal.        Behavior: Behavior normal.        Thought Content: Thought content normal.        Judgment: Judgment normal.      No results found for any visits on 07/26/22.    The 10-year ASCVD risk score (Arnett DK, et al., 2019) is: 19.1%    Assessment & Plan:   Problem List Items Addressed This Visit       Cardiovascular and Mediastinum   Primary hypertension    Blood pressure still not well controlled on lisinopril 20 mg.  Patient will take 2 of his 20 mg lisinopril tablets until finishes current prescription that he will pick up new prescription of lisinopril 40 mg 1 tablet daily.  Follow-up in 1 month for blood work and blood pressure recheck      Relevant Medications   lisinopril (ZESTRIL) 40 MG tablet   Other Relevant Orders   CBC   Comprehensive metabolic panel     Musculoskeletal and Integument   Gouty arthritis    Currently maintained on allopurinol.  Check CMP and uric acid pending      Relevant Orders   Uric acid     Other   Class 1 obesity due to excess calories with serious comorbidity and body mass index (BMI) of 31.0 to 31.9 in adult    Encouraged healthy lifestyle modifications inclusive of 30 minutes of exercise 5 times a week.      Relevant Orders   Lipid panel   Preventative health care - Primary    Discussed age-appropriate immunizations and screening exams.  Did print off health maintenance and anticipatory guidance sheet and was given to patient at dismissal of clinic.      Relevant Orders   CBC   Comprehensive metabolic panel   Hemoglobin A1c   TSH   Prediabetes    Patient has lost a small amount of weight since previous office visits.  Pending A1c today      Relevant Orders   Hemoglobin A1c   Lipid panel   Other Visit Diagnoses     Screening for colon  cancer       Relevant Orders   Ambulatory referral to Gastroenterology   Tobacco use  Relevant Orders   POCT urinalysis dipstick       Return in about 4 weeks (around 08/23/2022) for BP recheck Fasting appt.    Audria Nine, NP

## 2022-07-26 NOTE — Assessment & Plan Note (Signed)
Discussed age-appropriate immunizations and screening exams.  Did print off health maintenance and anticipatory guidance sheet and was given to patient at dismissal of clinic.

## 2022-07-26 NOTE — Assessment & Plan Note (Signed)
Blood pressure still not well controlled on lisinopril 20 mg.  Patient will take 2 of his 20 mg lisinopril tablets until finishes current prescription that he will pick up new prescription of lisinopril 40 mg 1 tablet daily.  Follow-up in 1 month for blood work and blood pressure recheck

## 2022-07-26 NOTE — Patient Instructions (Addendum)
Que bueno verte hoy Quiero que trabajes en tu ejercicio. La recomendacin es 30 minutos al da 5 das a la [place]. Puedes empezar despacio y Art gallery manager 2 das a la semana con 15 minutos al da.  Haz un seguimiento conmigo en 1 mes para volver a Chief Technology Officer tu presin arterial.  Tome 2 tabletas de lisinopril de 20 mg hasta que se acaben, luego tome las nuevas tabletas de lisinopril de 40 mg y tmelas una vez al Training and development officer.

## 2022-07-26 NOTE — Assessment & Plan Note (Signed)
Encouraged healthy lifestyle modifications inclusive of 30 minutes of exercise 5 times a week. 

## 2022-07-28 ENCOUNTER — Other Ambulatory Visit: Payer: Self-pay

## 2022-07-28 ENCOUNTER — Telehealth: Payer: Self-pay

## 2022-07-28 DIAGNOSIS — Z1211 Encounter for screening for malignant neoplasm of colon: Secondary | ICD-10-CM

## 2022-07-28 NOTE — Telephone Encounter (Signed)
Gastroenterology Pre-Procedure Review  Request Date: 11/01/22 Requesting Physician: Dr. Vicente Males  PATIENT REVIEW QUESTIONS: The patient responded to the following health history questions as indicated:    1. Are you having any GI issues? no 2. Do you have a personal history of Polyps? no 3. Do you have a family history of Colon Cancer or Polyps? no 4. Diabetes Mellitus? no 5. Joint replacements in the past 12 months?no 6. Major health problems in the past 3 months?no 7. Any artificial heart valves, MVP, or defibrillator?no    MEDICATIONS & ALLERGIES:    Patient reports the following regarding taking any anticoagulation/antiplatelet therapy:   Plavix, Coumadin, Eliquis, Xarelto, Lovenox, Pradaxa, Brilinta, or Effient? no Aspirin? no  Patient confirms/reports the following medications:  Current Outpatient Medications  Medication Sig Dispense Refill   allopurinol (ZYLOPRIM) 300 MG tablet Take 1 tablet (300 mg total) by mouth daily. 30 tablet 6   colchicine 0.6 MG tablet Take 1 tablet (0.6 mg total) by mouth daily. 30 tablet 0   lisinopril (ZESTRIL) 40 MG tablet Take 1 tablet (40 mg total) by mouth daily. 90 tablet 3   meloxicam (MOBIC) 7.5 MG tablet Take 1 tablet (7.5 mg total) by mouth daily. 30 tablet 0   rosuvastatin (CRESTOR) 5 MG tablet TAKE 1 TABLET (5 MG TOTAL) BY MOUTH DAILY. 90 tablet 1   No current facility-administered medications for this visit.    Patient confirms/reports the following allergies:  No Known Allergies  No orders of the defined types were placed in this encounter.   AUTHORIZATION INFORMATION Primary Insurance: 1D#: Group #:  Secondary Insurance: 1D#: Group #:  SCHEDULE INFORMATION: Date:  Time: Location:

## 2022-08-20 ENCOUNTER — Other Ambulatory Visit: Payer: Self-pay | Admitting: Nurse Practitioner

## 2022-08-20 DIAGNOSIS — M109 Gout, unspecified: Secondary | ICD-10-CM

## 2022-08-23 ENCOUNTER — Ambulatory Visit: Payer: 59 | Admitting: Nurse Practitioner

## 2022-08-23 VITALS — BP 122/80 | HR 80 | Temp 97.0°F | Resp 10 | Ht 64.0 in | Wt 184.5 lb

## 2022-08-23 DIAGNOSIS — I10 Essential (primary) hypertension: Secondary | ICD-10-CM

## 2022-08-23 DIAGNOSIS — Z6831 Body mass index (BMI) 31.0-31.9, adult: Secondary | ICD-10-CM

## 2022-08-23 DIAGNOSIS — Z125 Encounter for screening for malignant neoplasm of prostate: Secondary | ICD-10-CM

## 2022-08-23 DIAGNOSIS — E6609 Other obesity due to excess calories: Secondary | ICD-10-CM

## 2022-08-23 DIAGNOSIS — Z0000 Encounter for general adult medical examination without abnormal findings: Secondary | ICD-10-CM | POA: Diagnosis not present

## 2022-08-23 LAB — LIPID PANEL
Cholesterol: 216 mg/dL — ABNORMAL HIGH (ref 0–200)
HDL: 54.5 mg/dL (ref >39.00)
LDL Cholesterol: 131 mg/dL — ABNORMAL HIGH (ref 0–99)
NonHDL: 161.74
Total CHOL/HDL Ratio: 4
Triglycerides: 156 mg/dL — ABNORMAL HIGH (ref 0.0–149.0)
VLDL: 31.2 mg/dL (ref 0.0–40.0)

## 2022-08-23 LAB — COMPREHENSIVE METABOLIC PANEL
ALT: 27 U/L (ref 0–53)
AST: 23 U/L (ref 0–37)
Albumin: 4.4 g/dL (ref 3.5–5.2)
Alkaline Phosphatase: 79 U/L (ref 39–117)
BUN: 10 mg/dL (ref 6–23)
CO2: 26 mEq/L (ref 19–32)
Calcium: 9.5 mg/dL (ref 8.4–10.5)
Chloride: 105 mEq/L (ref 96–112)
Creatinine, Ser: 0.8 mg/dL (ref 0.40–1.50)
GFR: 104.07 mL/min (ref >60.00)
Glucose, Bld: 104 mg/dL — ABNORMAL HIGH (ref 70–99)
Potassium: 4.3 mEq/L (ref 3.5–5.1)
Sodium: 140 mEq/L (ref 135–145)
Total Bilirubin: 0.5 mg/dL (ref 0.2–1.2)
Total Protein: 6.9 g/dL (ref 6.0–8.3)

## 2022-08-23 LAB — CBC
HCT: 43.4 % (ref 39.0–52.0)
Hemoglobin: 14.8 g/dL (ref 13.0–17.0)
MCHC: 34 g/dL (ref 30.0–36.0)
MCV: 94.1 fl (ref 78.0–100.0)
Platelets: 257 10*3/uL (ref 150.0–400.0)
RBC: 4.61 Mil/uL (ref 4.22–5.81)
RDW: 13.4 % (ref 11.5–15.5)
WBC: 7.6 10*3/uL (ref 4.0–10.5)

## 2022-08-23 LAB — HEMOGLOBIN A1C: Hgb A1c MFr Bld: 5.8 % (ref 4.6–6.5)

## 2022-08-23 LAB — TSH: TSH: 1.39 u[IU]/mL (ref 0.35–5.50)

## 2022-08-23 LAB — PSA: PSA: 0.59 ng/mL (ref 0.10–4.00)

## 2022-08-23 NOTE — Assessment & Plan Note (Signed)
Patient's blood pressure within normal limit.  Tolerating medication well.  Continue amlodipine 10 mg daily

## 2022-08-23 NOTE — Progress Notes (Signed)
   Established Patient Office Visit  Subjective   Patient ID: Jimmy Hart, male    DOB: September 21, 1971  Age: 51 y.o. MRN: [id number]  Chief Complaint  Patient presents with   Hypertension    Follow up   gout follow up    Doing better, some pain present but thinks its from the job he does.      HTN: States that he was titrated to lisinopril 40mg  form 20mg . He ha sbene able to check his blood pressure at home twice as the machine keeps cutting off. States that he does not remember the numbers. Tolerating the medication well per his report  Interpreter: Brayton Layman [phone]    Review of Systems  Constitutional:  Negative for chills and fever.  Respiratory:  Negative for shortness of breath.   Cardiovascular:  Negative for chest pain.  Neurological:  Negative for dizziness and headaches.      Objective:     BP 122/80   Pulse 80   Temp (!) 97 F (36.1 C) (Temporal)   Resp 10   Ht 5\' 4"  (1.626 m)   Wt 184 lb 8 oz (83.7 kg)   SpO2 99%   BMI 31.67 kg/m    Physical Exam Vitals and nursing note reviewed.  Constitutional:      Appearance: Normal appearance. He is obese.  Cardiovascular:     Rate and Rhythm: Normal rate and regular rhythm.  Pulmonary:     Effort: Pulmonary effort is normal.     Breath sounds: Normal breath sounds.  Musculoskeletal:     Right lower leg: No edema.     Left lower leg: No edema.  Neurological:     Mental Status: He is alert.      No results found for any visits on 08/23/22.    The 10-year ASCVD risk score (Arnett DK, et al., 2019) is: 11.9%    Assessment & Plan:   Problem List Items Addressed This Visit       Cardiovascular and Mediastinum   Primary hypertension - Primary    Patient's blood pressure within normal limit.  Tolerating medication well.  Continue amlodipine 10 mg daily      Relevant Orders   TSH   Lipid panel     Other   Class 1 obesity due to excess calories with serious comorbidity and body mass index (BMI)  of 31.0 to 31.9 in adult   Relevant Orders   Hemoglobin A1c   Lipid panel   Preventative health care    Was seen for physical approximately 1 month ago.  Patient is not fasting so we delayed getting labs today.  Patient is not here for physical today.  She is using Dx code for labs      Relevant Orders   CBC   Comprehensive metabolic panel   Hemoglobin A1c   TSH   Lipid panel   Other Visit Diagnoses     Screening for prostate cancer       Relevant Orders   PSA       Return in about 6 months (around 02/21/2023) for HTN recheck .    Romilda Garret, NP

## 2022-08-23 NOTE — Patient Instructions (Signed)
Nice to see you today I want to see you in 6 months, sooner if you need me Have fun and be safe on your trip

## 2022-08-23 NOTE — Assessment & Plan Note (Signed)
Was seen for physical approximately 1 month ago.  Patient is not fasting so we delayed getting labs today.  Patient is not here for physical today.  She is using Dx code for labs

## 2022-10-21 ENCOUNTER — Telehealth: Payer: Self-pay | Admitting: *Deleted

## 2022-10-21 NOTE — Telephone Encounter (Addendum)
Left message. We need to reschedule patient colonoscopy due Dr Vicente Males will not be here on 11/01/2022.

## 2022-10-25 NOTE — Telephone Encounter (Signed)
Patient has been contacted in regard to his 11/01/22.  He is currently in [place]-I've informed him that we will need to reschedule his colonoscopy from 11/01/22.  I've asked him to call me back when he returns to town to reschedule.  He does understand that we will reschedule his 11/01/22 colonoscopy once he calls me back in a few days.  Thanks,  [place], [place]

## 2022-11-01 ENCOUNTER — Ambulatory Visit: Admission: RE | Admit: 2022-11-01 | Payer: 59 | Source: Home / Self Care | Admitting: Gastroenterology

## 2022-11-01 ENCOUNTER — Encounter: Admission: RE | Payer: Self-pay | Source: Home / Self Care

## 2022-11-01 SURGERY — COLONOSCOPY WITH PROPOFOL
Anesthesia: General

## 2022-11-19 ENCOUNTER — Other Ambulatory Visit: Payer: Self-pay | Admitting: Nurse Practitioner

## 2023-05-12 ENCOUNTER — Other Ambulatory Visit: Payer: Self-pay | Admitting: Nurse Practitioner

## 2023-05-12 NOTE — Telephone Encounter (Signed)
Can we get the patient scheduled for his CPE please need to be 07/28/2023 or a little after

## 2023-05-13 NOTE — Telephone Encounter (Signed)
Spoke to pt, scheduled cpe for 08/03/23

## 2023-07-09 ENCOUNTER — Other Ambulatory Visit: Payer: Self-pay | Admitting: Nurse Practitioner

## 2023-07-09 DIAGNOSIS — I10 Essential (primary) hypertension: Secondary | ICD-10-CM

## 2023-08-02 ENCOUNTER — Other Ambulatory Visit: Payer: Self-pay | Admitting: Nurse Practitioner

## 2023-08-02 DIAGNOSIS — I10 Essential (primary) hypertension: Secondary | ICD-10-CM

## 2023-08-03 ENCOUNTER — Ambulatory Visit (INDEPENDENT_AMBULATORY_CARE_PROVIDER_SITE_OTHER): Payer: 59 | Admitting: Nurse Practitioner

## 2023-08-03 ENCOUNTER — Encounter: Payer: Self-pay | Admitting: Nurse Practitioner

## 2023-08-03 VITALS — BP 122/70 | HR 87 | Temp 98.5°F | Ht 64.0 in | Wt 182.8 lb

## 2023-08-03 DIAGNOSIS — Z720 Tobacco use: Secondary | ICD-10-CM | POA: Diagnosis not present

## 2023-08-03 DIAGNOSIS — E785 Hyperlipidemia, unspecified: Secondary | ICD-10-CM | POA: Diagnosis not present

## 2023-08-03 DIAGNOSIS — Z0000 Encounter for general adult medical examination without abnormal findings: Secondary | ICD-10-CM | POA: Diagnosis not present

## 2023-08-03 DIAGNOSIS — E669 Obesity, unspecified: Secondary | ICD-10-CM

## 2023-08-03 DIAGNOSIS — R7303 Prediabetes: Secondary | ICD-10-CM

## 2023-08-03 DIAGNOSIS — Z23 Encounter for immunization: Secondary | ICD-10-CM

## 2023-08-03 DIAGNOSIS — Z125 Encounter for screening for malignant neoplasm of prostate: Secondary | ICD-10-CM | POA: Diagnosis not present

## 2023-08-03 DIAGNOSIS — M109 Gout, unspecified: Secondary | ICD-10-CM | POA: Diagnosis not present

## 2023-08-03 DIAGNOSIS — Z1211 Encounter for screening for malignant neoplasm of colon: Secondary | ICD-10-CM

## 2023-08-03 DIAGNOSIS — I10 Essential (primary) hypertension: Secondary | ICD-10-CM | POA: Diagnosis not present

## 2023-08-03 MED ORDER — ALLOPURINOL 300 MG PO TABS: 300.0000 mg | ORAL_TABLET | Freq: Every day | ORAL | 3 refills | Status: AC

## 2023-08-03 MED ORDER — LISINOPRIL 40 MG PO TABS: 40.0000 mg | ORAL_TABLET | Freq: Every day | ORAL | 3 refills | Status: AC

## 2023-08-03 MED ORDER — ROSUVASTATIN CALCIUM 5 MG PO TABS: 5.0000 mg | ORAL_TABLET | Freq: Every day | ORAL | 3 refills | Status: AC

## 2023-08-03 NOTE — Assessment & Plan Note (Signed)
Pending A1c today.

## 2023-08-03 NOTE — Assessment & Plan Note (Signed)
Patient prescribed rosuvastatin 5 mg daily.  States has not been taking it.  Pending lipid panel today restart rosuvastatin 5 mg daily

## 2023-08-03 NOTE — Assessment & Plan Note (Signed)
Discussed age-appropriate immunizations and screening exams.  Did review patient's personal, surgical, social, family histories.  Patient is up-to-date on all age-appropriate vaccinations he would like.  Patient deferred flu vaccine today will administer first shingles vaccine in office.  Referral to GI for CRC screening.  PSA for prostate cancer screening.  Patient was given information at discharge about preventative healthcare maintenance with anticipatory guidance.

## 2023-08-03 NOTE — Progress Notes (Unsigned)
Established Patient Office Visit  Subjective   Patient ID: Jimmy Hart, male    DOB: [birth year]  Age: 52 y.o. MRN: [id number]  Chief Complaint  Patient presents with   Annual Exam    HPI  Dorma Russell # [id number]  HTN: states that he does not check his blood pressure at home. He tolerates the medication well   Gout: on allopurinol and has not had any flares   for complete physical and follow up of chronic conditions.  Immunizations: -Tetanus: Completed in 2016 -Influenza: refused  -Shingles:  discussed in office  -Pneumonia: Too young   Diet: Fair diet. He is eating a small breakfast and lunch, he will have dinner and has coffee in the morning. He will have water and soda sometimes  Exercise: No regular exercise.  Eye exam: PRN  Dental exam: Completes annually in [place]   Colonoscopy: Completed in referred last year but was cancelled  Lung Cancer Screening: Completed in   PSA: Due  Sleep: he goes to bed around 9 and will get up around 6-630. Feels rested most of the time. Does snore.    {History (Optional):23778}  Review of Systems  Constitutional:  Negative for chills and fever.  Respiratory:  Negative for shortness of breath.   Cardiovascular:  Negative for chest pain and leg swelling.  Gastrointestinal:  Negative for abdominal pain, blood in stool, constipation, diarrhea, nausea and vomiting.  Genitourinary:  Negative for dysuria and hematuria.  Neurological:  Negative for tingling and headaches.  Psychiatric/Behavioral:  Negative for hallucinations and suicidal ideas.       Objective:     BP 122/70   Pulse 87   Temp 98.5 F (36.9 C) (Oral)   Ht 5\' 4"  (1.626 m)   Wt 182 lb 12.8 oz (82.9 kg)   SpO2 94%   BMI 31.38 kg/m  {Vitals History (Optional):23777}  Physical Exam Vitals and nursing note reviewed.  Constitutional:      Appearance: Normal appearance.  HENT:     Right Ear: Tympanic membrane, ear canal and external ear normal.     Left Ear:  Tympanic membrane, ear canal and external ear normal.     Mouth/Throat:     Mouth: Mucous membranes are moist.     Pharynx: Oropharynx is clear.  Eyes:     Extraocular Movements: Extraocular movements intact.     Pupils: Pupils are equal, round, and reactive to light.  Cardiovascular:     Rate and Rhythm: Normal rate and regular rhythm.     Pulses: Normal pulses.     Heart sounds: Normal heart sounds.  Pulmonary:     Effort: Pulmonary effort is normal.     Breath sounds: Normal breath sounds.  Abdominal:     General: Bowel sounds are normal. There is no distension.     Palpations: There is no mass.     Tenderness: There is no abdominal tenderness.     Hernia: No hernia is present.  Musculoskeletal:     Right lower leg: No edema.     Left lower leg: No edema.  Lymphadenopathy:     Cervical: No cervical adenopathy.  Skin:    General: Skin is warm.  Neurological:     General: No focal deficit present.     Mental Status: He is alert.     Deep Tendon Reflexes:     Reflex Scores:      Bicep reflexes are 2+ on the right side and 2+ on the left side.  Patellar reflexes are 2+ on the right side and 2+ on the left side.    Comments: Bilateral upper and lower extremity strength 5/5  Psychiatric:        Mood and Affect: Mood normal.        Behavior: Behavior normal.        Thought Content: Thought content normal.        Judgment: Judgment normal.      No results found for any visits on 08/03/23.  {Labs (Optional):23779}  The 10-year ASCVD risk score (Arnett DK, et al., 2019) is: 8.4%    Assessment & Plan:   Problem List Items Addressed This Visit       Cardiovascular and Mediastinum   Primary hypertension    Patient currently maintained on lisinopril 40 mg daily.  Blood pressure within normal limits tolerating medication well continue      Relevant Medications   lisinopril (ZESTRIL) 40 MG tablet   rosuvastatin (CRESTOR) 5 MG tablet   Other Relevant Orders   CBC  with Differential/Platelet   Comprehensive metabolic panel   TSH     Musculoskeletal and Integument   Gouty arthritis    Allopurinol 300 mg daily.  Pending uric acid      Relevant Medications   allopurinol (ZYLOPRIM) 300 MG tablet   Other Relevant Orders   Uric Acid     Other   Preventative health care - Primary    Discussed age-appropriate immunizations and screening exams.  Did review patient's personal, surgical, social, family histories.  Patient is up-to-date on all age-appropriate vaccinations he would like.  Patient deferred flu vaccine today will administer first shingles vaccine in office.  Referral to GI for CRC screening.  PSA for prostate cancer screening.  Patient was given information at discharge about preventative healthcare maintenance with anticipatory guidance.      Relevant Orders   CBC with Differential/Platelet   Comprehensive metabolic panel   TSH   Prediabetes    Pending A1c today.      Relevant Orders   Hemoglobin A1c   Obesity (BMI 30-39.9)    Pending A1c, lipid, TSH.  Work on healthy lifestyle modifications      Relevant Orders   Hemoglobin A1c   Lipid Panel   Tobacco use    Urine microscopy to rule out microscopic hematuria       Hyperlipidemia    Patient prescribed rosuvastatin 5 mg daily.  States has not been taking it.  Pending lipid panel today restart rosuvastatin 5 mg daily      Relevant Medications   lisinopril (ZESTRIL) 40 MG tablet   rosuvastatin (CRESTOR) 5 MG tablet   Other Relevant Orders   Lipid Panel   Other Visit Diagnoses     Need for shingles vaccine       Relevant Orders   Zoster Recombinant (Shingrix )   Screening for prostate cancer       Relevant Orders   PSA   Screening for colon cancer       Relevant Orders   Ambulatory referral to Gastroenterology       Return in about 1 year (around 08/02/2024) for CPE and Labs.    Audria Nine, NP

## 2023-08-03 NOTE — Patient Instructions (Addendum)
Encantado de verte hoy Estar en contacto con los laboratorios. He enviado recargas de todos sus medicamentos a la [employer].  Haz un seguimiento conmigo en 1 ao, antes si me necesitas.

## 2023-08-03 NOTE — Assessment & Plan Note (Signed)
Patient currently maintained on lisinopril 40 mg daily.  Blood pressure within normal limits tolerating medication well continue

## 2023-08-03 NOTE — Assessment & Plan Note (Signed)
Pending A1c, lipid, TSH.  Work on healthy lifestyle modifications

## 2023-08-03 NOTE — Assessment & Plan Note (Signed)
Allopurinol 300 mg daily.  Pending uric acid

## 2023-08-03 NOTE — Assessment & Plan Note (Signed)
Urine microscopy to rule out microscopic hematuria

## 2023-08-04 LAB — COMPREHENSIVE METABOLIC PANEL
ALT: 28 U/L (ref 0–53)
AST: 20 U/L (ref 0–37)
Albumin: 4.5 g/dL (ref 3.5–5.2)
Alkaline Phosphatase: 78 U/L (ref 39–117)
BUN: 15 mg/dL (ref 6–23)
CO2: 28 meq/L (ref 19–32)
Calcium: 9.5 mg/dL (ref 8.4–10.5)
Chloride: 103 meq/L (ref 96–112)
Creatinine, Ser: 1 mg/dL (ref 0.40–1.50)
GFR: 87.92 mL/min (ref >60.00)
Glucose, Bld: 88 mg/dL (ref 70–99)
Potassium: 4.1 meq/L (ref 3.5–5.1)
Sodium: 141 meq/L (ref 135–145)
Total Bilirubin: 0.5 mg/dL (ref 0.2–1.2)
Total Protein: 7.1 g/dL (ref 6.0–8.3)

## 2023-08-04 LAB — CBC WITH DIFFERENTIAL/PLATELET
Basophils Absolute: 0.1 10*3/uL (ref 0.0–0.1)
Basophils Relative: 0.7 % (ref 0.0–3.0)
Eosinophils Absolute: 0.1 10*3/uL (ref 0.0–0.7)
Eosinophils Relative: 1.2 % (ref 0.0–5.0)
HCT: 43.7 % (ref 39.0–52.0)
Hemoglobin: 14.2 g/dL (ref 13.0–17.0)
Lymphocytes Relative: 28.5 % (ref 12.0–46.0)
Lymphs Abs: 2.7 10*3/uL (ref 0.7–4.0)
MCHC: 32.5 g/dL (ref 30.0–36.0)
MCV: 98.4 fL (ref 78.0–100.0)
Monocytes Absolute: 0.8 10*3/uL (ref 0.1–1.0)
Monocytes Relative: 8.1 % (ref 3.0–12.0)
Neutro Abs: 5.8 10*3/uL (ref 1.4–7.7)
Neutrophils Relative %: 61.5 % (ref 43.0–77.0)
Platelets: 274 10*3/uL (ref 150.0–400.0)
RBC: 4.44 Mil/uL (ref 4.22–5.81)
RDW: 14.1 % (ref 11.5–15.5)
WBC: 9.4 10*3/uL (ref 4.0–10.5)

## 2023-08-04 LAB — TSH: TSH: 1.44 u[IU]/mL (ref 0.35–5.50)

## 2023-08-04 LAB — LIPID PANEL
Cholesterol: 229 mg/dL — ABNORMAL HIGH (ref 0–200)
HDL: 50.4 mg/dL (ref >39.00)
LDL Cholesterol: 103 mg/dL — ABNORMAL HIGH (ref 0–99)
NonHDL: 178.96
Total CHOL/HDL Ratio: 5
Triglycerides: 382 mg/dL — ABNORMAL HIGH (ref 0.0–149.0)
VLDL: 76.4 mg/dL — ABNORMAL HIGH (ref 0.0–40.0)

## 2023-08-04 LAB — URINALYSIS, MICROSCOPIC ONLY

## 2023-08-04 LAB — URIC ACID: Uric Acid, Serum: 7.8 mg/dL (ref 4.0–7.8)

## 2023-08-04 LAB — HEMOGLOBIN A1C: Hgb A1c MFr Bld: 5.7 % (ref 4.6–6.5)

## 2023-08-04 LAB — PSA: PSA: 0.7 ng/mL (ref 0.10–4.00)

## 2023-08-10 ENCOUNTER — Encounter: Payer: Self-pay | Admitting: *Deleted

## 2023-11-22 ENCOUNTER — Ambulatory Visit: Payer: Self-pay
